# Patient Record
Sex: Female | Born: 1990 | Hispanic: No | Marital: Married | Smoking: Never smoker
Health system: Southern US, Community
[De-identification: ages and names within clinical notes are randomized; demographics above are authoritative.]

## PROBLEM LIST (undated history)

## (undated) DIAGNOSIS — Z789 Other specified health status: Secondary | ICD-10-CM

## (undated) HISTORY — DX: Other specified health status: Z78.9

## (undated) HISTORY — PX: NO PAST SURGERIES: SHX2092

---

## 2018-05-14 NOTE — L&D Delivery Note (Addendum)
Patient: Brittney Cox MRN: [id number]  GBS status: negative, IAP given none  Patient is a 28 y.o. now G3P3 s/p NSVD at [redacted]w[redacted]d, who was admitted for spontaneous onset of labor. AROM 0h 7m prior to delivery with clear fluid.   Delivery Note At 7:36 AM a viable and healthy female was delivered via Vaginal, Spontaneous (Presentation: ROA; vertex  ).  APGAR: pending ; weight pending .   Placenta status: spontaneous, intact.  3 vessel cord. With no complications.   Anesthesia:  None Episiotomy:  None Lacerations:  1st degree perineal tear Suture Repair: none Est. Blood Loss (mL):  371mL  Head delivered ROA. No nuchal cord present. Shoulder and body delivered in usual fashion. Infant with spontaneous cry, placed on mother's abdomen, dried and bulb suctioned. Cord clamped x 2 after 1-minute delay, and cut by family member. Cord blood drawn. Placenta delivered spontaneously with gentle cord traction. Fundus firm with massage and Pitocin. Perineum inspected and found to have 1st degree perineal laceration, which was found to be hemostatic.  Mom to postpartum.  Baby to Couplet care / Skin to Skin.  Danna Hefty 02/16/2019, 7:52 AM  Patient is a 28 y.o. at [redacted]w[redacted]d who was admitted for spontaneous onset of labor, uncomplicated prenatal course.  She progressed with augmentation via AROM.  I was gloved and present for delivery in its entirety.  Second stage of labor progressed, baby delivered after 3# contractions.   Complications: none  Lacerations: 1st degree, hemostatic  Wende Mott, CNM 8:44 AM

## 2018-09-19 DIAGNOSIS — Z3200 Encounter for pregnancy test, result unknown: Secondary | ICD-10-CM | POA: Diagnosis not present

## 2018-10-14 ENCOUNTER — Encounter: Payer: Self-pay | Admitting: Nurse Practitioner

## 2018-10-14 ENCOUNTER — Other Ambulatory Visit: Payer: Self-pay

## 2018-10-14 ENCOUNTER — Other Ambulatory Visit (HOSPITAL_COMMUNITY)
Admission: RE | Admit: 2018-10-14 | Discharge: 2018-10-14 | Disposition: A | Discharge status: Home / Self Care | Payer: Medicaid Other | Source: Ambulatory Visit | Attending: Nurse Practitioner | Admitting: Nurse Practitioner

## 2018-10-14 ENCOUNTER — Ambulatory Visit (INDEPENDENT_AMBULATORY_CARE_PROVIDER_SITE_OTHER): Payer: Medicaid Other | Admitting: Nurse Practitioner

## 2018-10-14 VITALS — BP 125/80 | HR 116 | Ht 65.0 in | Wt 165.6 lb

## 2018-10-14 DIAGNOSIS — Z3490 Encounter for supervision of normal pregnancy, unspecified, unspecified trimester: Secondary | ICD-10-CM | POA: Insufficient documentation

## 2018-10-14 DIAGNOSIS — Z315 Encounter for genetic counseling: Secondary | ICD-10-CM | POA: Diagnosis not present

## 2018-10-14 DIAGNOSIS — Z3481 Encounter for supervision of other normal pregnancy, first trimester: Secondary | ICD-10-CM | POA: Diagnosis not present

## 2018-10-14 DIAGNOSIS — O280 Abnormal hematological finding on antenatal screening of mother: Secondary | ICD-10-CM

## 2018-10-14 DIAGNOSIS — Z789 Other specified health status: Secondary | ICD-10-CM | POA: Insufficient documentation

## 2018-10-14 DIAGNOSIS — Z603 Acculturation difficulty: Secondary | ICD-10-CM

## 2018-10-14 DIAGNOSIS — Z3A17 17 weeks gestation of pregnancy: Secondary | ICD-10-CM

## 2018-10-14 DIAGNOSIS — Z3482 Encounter for supervision of other normal pregnancy, second trimester: Secondary | ICD-10-CM | POA: Diagnosis not present

## 2018-10-14 MED ORDER — VITAFOL ULTRA 29-0.6-0.4-200 MG PO CAPS: 1.0000 | ORAL_CAPSULE | Freq: Every day | ORAL | 11 refills | Status: AC

## 2018-10-14 NOTE — Patient Instructions (Addendum)
Safe Medications in Pregnancy   Acne: Benzoyl Peroxide Salicylic Acid  Backache/Headache: Tylenol: 2 regular strength every 4 hours OR              2 Extra strength every 6 hours  Colds/Coughs/Allergies: Benadryl (alcohol free) 25 mg every 6 hours as needed Breath right strips Claritin Cepacol throat lozenges Chloraseptic throat spray Cold-Eeze- up to three times per day Cough drops, alcohol free Flonase (by prescription only) Guaifenesin Mucinex Robitussin DM (plain only, alcohol free) Saline nasal spray/drops Sudafed (pseudoephedrine) & Actifed ** use only after [redacted] weeks gestation and if you do not have high blood pressure Tylenol Vicks Vaporub Zinc lozenges Zyrtec   Constipation: Colace Ducolax suppositories Fleet enema Glycerin suppositories Metamucil Milk of magnesia Miralax Senokot Smooth move tea  Diarrhea: Kaopectate Imodium A-D  *NO pepto Bismol  Hemorrhoids: Anusol Anusol HC Preparation H Tucks  Indigestion: Tums Maalox Mylanta Zantac  Pepcid  Insomnia: Benadryl (alcohol free) 25mg  every 6 hours as needed Tylenol PM Unisom, no Gelcaps  Leg Cramps: Tums MagGel  Nausea/Vomiting:  Bonine Dramamine Emetrol Ginger extract Sea bands Meclizine  Nausea medication to take during pregnancy:  Unisom (doxylamine succinate 25 mg tablets) Take one tablet daily at bedtime. If symptoms are not adequately controlled, the dose can be increased to a maximum recommended dose of two tablets daily (1/2 tablet in the morning, 1/2 tablet mid-afternoon and one at bedtime). Vitamin B6 100mg  tablets. Take one tablet twice a day (up to 200 mg per day).  Skin Rashes: Aveeno products Benadryl cream or 25mg  every 6 hours as needed Calamine Lotion 1% cortisone cream  Yeast infection: Gyne-lotrimin 7 Monistat 7   **If taking multiple medications, please check labels to avoid duplicating the same active ingredients **take medication as directed on  the label ** Do not exceed 4000 mg of tylenol in 24 hours **Do not take medications that contain aspirin or ibuprofen    Levonorgestrel intrauterine device (IUD) What is this medicine? LEVONORGESTREL IUD (LEE voe nor jes trel) is a contraceptive (birth control) device. The device is placed inside the uterus by a healthcare professional. It is used to prevent pregnancy. This device can also be used to treat heavy bleeding that occurs during your period. This medicine may be used for other purposes; ask your health care provider or pharmacist if you have questions. COMMON BRAND NAME(S): Cameron AliKyleena, LILETTA, Mirena, Skyla What should I tell my health care provider before I take this medicine? They need to know if you have any of these conditions: -abnormal Pap smear -cancer of the breast, uterus, or cervix -diabetes -endometritis -genital or pelvic infection now or in the past -have more than one sexual partner or your partner has more than one partner -heart disease -history of an ectopic or tubal pregnancy -immune system problems -IUD in place -liver disease or tumor -problems with blood clots or take blood-thinners -seizures -use intravenous drugs -uterus of unusual shape -vaginal bleeding that has not been explained -an unusual or allergic reaction to levonorgestrel, other hormones, silicone, or polyethylene, medicines, foods, dyes, or preservatives -pregnant or trying to get pregnant -breast-feeding How should I use this medicine? This device is placed inside the uterus by a health care professional. Talk to your pediatrician regarding the use of this medicine in children. Special care may be needed. Overdosage: If you think you have taken too much of this medicine contact a poison control center or emergency room at once. NOTE: This medicine is only for you. Do not  share this medicine with others. What if I miss a dose? This does not apply. Depending on the brand of device you  have inserted, the device will need to be replaced every 3 to 5 years if you wish to continue using this type of birth control. What may interact with this medicine? Do not take this medicine with any of the following medications: -amprenavir -bosentan -fosamprenavir This medicine may also interact with the following medications: -aprepitant -armodafinil -barbiturate medicines for inducing sleep or treating seizures -bexarotene -boceprevir -griseofulvin -medicines to treat seizures like carbamazepine, ethotoin, felbamate, oxcarbazepine, phenytoin, topiramate -modafinil -pioglitazone -rifabutin -rifampin -rifapentine -some medicines to treat HIV infection like atazanavir, efavirenz, indinavir, lopinavir, nelfinavir, tipranavir, ritonavir -St. John's wort -warfarin This list may not describe all possible interactions. Give your health care provider a list of all the medicines, herbs, non-prescription drugs, or dietary supplements you use. Also tell them if you smoke, drink alcohol, or use illegal drugs. Some items may interact with your medicine. What should I watch for while using this medicine? Visit your doctor or health care professional for regular check ups. See your doctor if you or your partner has sexual contact with others, becomes HIV positive, or gets a sexual transmitted disease. This product does not protect you against HIV infection (AIDS) or other sexually transmitted diseases. You can check the placement of the IUD yourself by reaching up to the top of your vagina with clean fingers to feel the threads. Do not pull on the threads. It is a good habit to check placement after each menstrual period. Call your doctor right away if you feel more of the IUD than just the threads or if you cannot feel the threads at all. The IUD may come out by itself. You may become pregnant if the device comes out. If you notice that the IUD has come out use a backup birth control method like  condoms and call your health care provider. Using tampons will not change the position of the IUD and are okay to use during your period. This IUD can be safely scanned with magnetic resonance imaging (MRI) only under specific conditions. Before you have an MRI, tell your healthcare provider that you have an IUD in place, and which type of IUD you have in place. What side effects may I notice from receiving this medicine? Side effects that you should report to your doctor or health care professional as soon as possible: -allergic reactions like skin rash, itching or hives, swelling of the face, lips, or tongue -fever, flu-like symptoms -genital sores -high blood pressure -no menstrual period for 6 weeks during use -pain, swelling, warmth in the leg -pelvic pain or tenderness -severe or sudden headache -signs of pregnancy -stomach cramping -sudden shortness of breath -trouble with balance, talking, or walking -unusual vaginal bleeding, discharge -yellowing of the eyes or skin Side effects that usually do not require medical attention (report to your doctor or health care professional if they continue or are bothersome): -acne -breast pain -change in sex drive or performance -changes in weight -cramping, dizziness, or faintness while the device is being inserted -headache -irregular menstrual bleeding within first 3 to 6 months of use -nausea This list may not describe all possible side effects. Call your doctor for medical advice about side effects. You may report side effects to FDA at 1-800-FDA-1088. Where should I keep my medicine? This does not apply. NOTE: This sheet is a summary. It may not cover all possible information. If you  have questions about this medicine, talk to your doctor, pharmacist, or health care provider.  2019 Elsevier/Gold Standard (2016-02-10 14:14:56)  Second Trimester of Pregnancy  The second trimester is from week 14 through week 27 (month 4 through 6).  This is often the time in pregnancy that you feel your best. Often times, morning sickness has lessened or quit. You may have more energy, and you may get hungry more often. Your unborn baby is growing rapidly. At the end of the sixth month, he or she is about 9 inches long and weighs about 1 pounds. You will likely feel the baby move between 18 and 20 weeks of pregnancy. Follow these instructions at home: Medicines  Take over-the-counter and prescription medicines only as told by your doctor. Some medicines are safe and some medicines are not safe during pregnancy.  Take a prenatal vitamin that contains at least 600 micrograms (mcg) of folic acid.  If you have trouble pooping (constipation), take medicine that will make your stool soft (stool softener) if your doctor approves. Eating and drinking   Eat regular, healthy meals.  Avoid raw meat and uncooked cheese.  If you get low calcium from the food you eat, talk to your doctor about taking a daily calcium supplement.  Avoid foods that are high in fat and sugars, such as fried and sweet foods.  If you feel sick to your stomach (nauseous) or throw up (vomit): ? Eat 4 or 5 small meals a day instead of 3 large meals. ? Try eating a few soda crackers. ? Drink liquids between meals instead of during meals.  To prevent constipation: ? Eat foods that are high in fiber, like fresh fruits and vegetables, whole grains, and beans. ? Drink enough fluids to keep your pee (urine) clear or pale yellow. Activity  Exercise only as told by your doctor. Stop exercising if you start to have cramps.  Do not exercise if it is too hot, too humid, or if you are in a place of great height (high altitude).  Avoid heavy lifting.  Wear low-heeled shoes. Sit and stand up straight.  You can continue to have sex unless your doctor tells you not to. Relieving pain and discomfort  Wear a good support bra if your breasts are tender.  Take warm water baths  (sitz baths) to soothe pain or discomfort caused by hemorrhoids. Use hemorrhoid cream if your doctor approves.  Rest with your legs raised if you have leg cramps or low back pain.  If you develop puffy, bulging veins (varicose veins) in your legs: ? Wear support hose or compression stockings as told by your doctor. ? Raise (elevate) your feet for 15 minutes, 3-4 times a day. ? Limit salt in your food. Prenatal care  Write down your questions. Take them to your prenatal visits.  Keep all your prenatal visits as told by your doctor. This is important. Safety  Wear your seat belt when driving.  Make a list of emergency phone numbers, including numbers for family, friends, the hospital, and police and fire departments. General instructions  Ask your doctor about the right foods to eat or for help finding a counselor, if you need these services.  Ask your doctor about local prenatal classes. Begin classes before month 6 of your pregnancy.  Do not use hot tubs, steam rooms, or saunas.  Do not douche or use tampons or scented sanitary pads.  Do not cross your legs for long periods of time.  Visit your  dentist if you have not done so. Use a soft toothbrush to brush your teeth. Floss gently.  Avoid all smoking, herbs, and alcohol. Avoid drugs that are not approved by your doctor.  Do not use any products that contain nicotine or tobacco, such as cigarettes and e-cigarettes. If you need help quitting, ask your doctor.  Avoid cat litter boxes and soil used by cats. These carry germs that can cause birth defects in the baby and can cause a loss of your baby (miscarriage) or stillbirth. Contact a doctor if:  You have mild cramps or pressure in your lower belly.  You have pain when you pee (urinate).  You have bad smelling fluid coming from your vagina.  You continue to feel sick to your stomach (nauseous), throw up (vomit), or have watery poop (diarrhea).  You have a nagging pain in  your belly area.  You feel dizzy. Get help right away if:  You have a fever.  You are leaking fluid from your vagina.  You have spotting or bleeding from your vagina.  You have severe belly cramping or pain.  You lose or gain weight rapidly.  You have trouble catching your breath and have chest pain.  You notice sudden or extreme puffiness (swelling) of your face, hands, ankles, feet, or legs.  You have not felt the baby move in over an hour.  You have severe headaches that do not go away when you take medicine.  You have trouble seeing. Summary  The second trimester is from week 14 through week 27 (months 4 through 6). This is often the time in pregnancy that you feel your best.  To take care of yourself and your unborn baby, you will need to eat healthy meals, take medicines only if your doctor tells you to do so, and do activities that are safe for you and your baby.  Call your doctor if you get sick or if you notice anything unusual about your pregnancy. Also, call your doctor if you need help with the right food to eat, or if you want to know what activities are safe for you. This information is not intended to replace advice given to you by your health care provider. Make sure you discuss any questions you have with your health care provider. Document Released: 07/25/2009 Document Revised: 06/05/2016 Document Reviewed: 06/05/2016 Elsevier Interactive Patient Education  2019 [employer].

## 2018-10-14 NOTE — Progress Notes (Signed)
Subjective:   Brittney Cox is a 28 y.o. G3P2002 at 4115w4d by LMP being seen today for her Bridget Hartshornfirst obstetrical visit.  Her obstetrical history is significant for language barrier. Patient does intend to breast feed. Pregnancy history fully reviewed.  Interpreter present for entire interview, labs, exam and checkout.  Patient reports some pelvic pressure when doing housework. Is not constant,  No vaginal bleeding.  [place] in the [place] since [place] and lived in [place] and had [place] other 2 babies there.  Reports no problems with those pregnancies.  HISTORY: OB History  Gravida Para Term Preterm AB Living  3 2 2  0 0 2  SAB TAB Ectopic Multiple Live Births  0 0 0 0 2    # Outcome Date GA Lbr Len/2nd Weight Sex Delivery Anes PTL Lv  3 Current           2 Term 07/13/14    M Vag-Spont   LIV  1 Term 07/04/12    F Vag-Spont   LIV   History reviewed. No pertinent past medical history. History reviewed. No pertinent surgical history. History reviewed. No pertinent family history. Social History   Tobacco Use  . Smoking status: Never Smoker  . Smokeless tobacco: Never Used  Substance Use Topics  . Alcohol use: Never    Frequency: Never  . Drug use: Never   Not on File Current Outpatient Medications on File Prior to Visit  Medication Sig Dispense Refill  . Prenatal Vit-Fe Fumarate-FA (MULTIVITAMIN-PRENATAL) 27-0.8 MG TABS tablet Take 1 tablet by mouth daily at 12 noon.     No current facility-administered medications on file prior to visit.      Exam   Vitals:   10/14/18 0935 10/14/18 0936  BP: 125/80   Pulse: (!) 116   Weight: 165 lb 9.6 oz (75.1 kg)   Height:  5\' 5"  (1.651 m)   Fetal Heart Rate (bpm): 155  Uterus:   At umbilicus  Pelvic Exam: Perineum: no hemorrhoids, normal perineum   Vulva: normal external genitalia, no lesions   Vagina:  normal mucosa, normal discharge   Cervix: no lesions and normal, pap smear done.    Adnexa: normal adnexa and no mass, fullness,  tenderness   Bony Pelvis: average  System: General: well-developed, well-nourished female in no acute distress   Breast:  normal appearance, no masses or tenderness   Skin: normal coloration and turgor, no rashes   Neurologic: oriented, normal, negative, normal mood   Extremities: normal strength, tone, and muscle mass, ROM of all joints is normal   HEENT extraocular movement intact and sclera clear, anicteric   Mouth/Teeth mucous membranes moist, pharynx normal without lesions and dental hygiene good   Neck supple and no masses, normal thyroid   Cardiovascular: regular rate and rhythm   Respiratory:  no respiratory distress, normal breath sounds   Abdomen: soft, non-tender; no masses,  no organomegaly     Assessment:   Pregnancy: [id number] Patient Active Problem List   Diagnosis Date Noted  . Encounter for supervision of normal pregnancy, unspecified, unspecified trimester 10/14/2018     Plan:  1. Encounter for supervision of normal pregnancy, antepartum, unspecified gravidity Reviewed that her [facility] with [facility] Reviewed that visits will be by phone sometimes and sometimes in the clinic.  Advised keeping her mask today and wearing it to visits here in the clinic. Prenatal vitamins ordered. - Cytology - PAP( [place]) -  Cervicovaginal ancillary only( [place]) - Genetic Screening - Culture, OB Urine - Obstetric Panel, Including HIV   Initial labs drawn. Continue prenatal vitamins. Genetic Screening discussed, AFP and NIPS: ordered. Ultrasound discussed; fetal anatomic survey: ordered. Problem list reviewed and updated. The nature of [facility] with multiple MDs and other Advanced Practice Providers was explained to patient; also emphasized that residents, students are part of our team. Routine obstetric precautions reviewed. Return in about 4 weeks (around 11/11/2018) for virtual vist with interpreter.   Total face-to-face time with patient: 40 minutes.  Over 50% of encounter was spent on counseling and coordination of care.     Nolene Bernheim, FNP Family Nurse Practitioner, [facility] for [employer], [facility] 10/14/2018 10:30 AM

## 2018-10-15 LAB — OBSTETRIC PANEL, INCLUDING HIV
Antibody Screen: NEGATIVE
Basophils Absolute: 0 10*3/uL (ref 0.0–0.2)
Basos: 0 %
EOS (ABSOLUTE): 0.1 10*3/uL (ref 0.0–0.4)
Eos: 1 %
HIV Screen 4th Generation wRfx: NONREACTIVE
Hematocrit: 31.7 % — ABNORMAL LOW (ref 34.0–46.6)
Hemoglobin: 10.8 g/dL — ABNORMAL LOW (ref 11.1–15.9)
Hepatitis B Surface Ag: NEGATIVE
Immature Grans (Abs): 0 10*3/uL (ref 0.0–0.1)
Immature Granulocytes: 0 %
Lymphocytes Absolute: 1 10*3/uL (ref 0.7–3.1)
Lymphs: 20 %
MCH: 30.4 pg (ref 26.6–33.0)
MCHC: 34.1 g/dL (ref 31.5–35.7)
MCV: 89 fL (ref 79–97)
Monocytes Absolute: 0.2 10*3/uL (ref 0.1–0.9)
Monocytes: 5 %
Neutrophils Absolute: 3.5 10*3/uL (ref 1.4–7.0)
Neutrophils: 74 %
Platelets: 199 10*3/uL (ref 150–450)
RBC: 3.55 x10E6/uL — ABNORMAL LOW (ref 3.77–5.28)
RDW: 12.9 % (ref 11.7–15.4)
RPR Ser Ql: NONREACTIVE
Rh Factor: POSITIVE
Rubella Antibodies, IGG: 5.59 index (ref >0.99)
WBC: 4.8 10*3/uL (ref 3.4–10.8)

## 2018-10-15 LAB — CERVICOVAGINAL ANCILLARY ONLY
Bacterial vaginitis: NEGATIVE
Candida vaginitis: NEGATIVE
Chlamydia: NEGATIVE
Neisseria Gonorrhea: NEGATIVE
Trichomonas: NEGATIVE

## 2018-10-16 DIAGNOSIS — O280 Abnormal hematological finding on antenatal screening of mother: Secondary | ICD-10-CM | POA: Insufficient documentation

## 2018-10-16 LAB — AFP, SERUM, OPEN SPINA BIFIDA
AFP MoM: 2.85
AFP Value: 105 ng/mL
Gest. Age on Collection Date: 17.4 weeks
Maternal Age At EDD: 30.7 yr
OSBR Risk 1 IN: 152
Test Results:: POSITIVE — AB
Weight: 165 [lb_av]

## 2018-10-16 LAB — CULTURE, OB URINE

## 2018-10-16 LAB — URINE CULTURE, OB REFLEX

## 2018-10-16 NOTE — Addendum Note (Signed)
Addended by: Currie Paris on: 10/16/2018 07:42 AM   Modules accepted: Orders

## 2018-10-16 NOTE — Progress Notes (Signed)
Abnormal AFP result.  Is scheduled for initial [place] on 10-24-18.  Requested genetic counseling at that visit.  Will review [place] EDC carefully and compare to LMP EDC as this test result may need to be recalculated if there is EDC discrepancy.  Nolene Bernheim, RN, MSN, NP-BC Nurse Practitioner, [facility] for [employer], [facility] 10/16/2018 7:41 AM

## 2018-10-17 LAB — CYTOLOGY - PAP
Diagnosis: NEGATIVE
HPV: NOT DETECTED

## 2018-10-22 ENCOUNTER — Encounter: Payer: Self-pay | Admitting: Nurse Practitioner

## 2018-10-24 ENCOUNTER — Other Ambulatory Visit: Payer: Self-pay

## 2018-10-24 ENCOUNTER — Other Ambulatory Visit: Payer: Self-pay | Admitting: Nurse Practitioner

## 2018-10-24 ENCOUNTER — Ambulatory Visit (HOSPITAL_COMMUNITY): Payer: Medicaid Other

## 2018-10-24 ENCOUNTER — Other Ambulatory Visit (HOSPITAL_COMMUNITY): Payer: Self-pay | Admitting: *Deleted

## 2018-10-24 ENCOUNTER — Ambulatory Visit (HOSPITAL_COMMUNITY): Payer: Medicaid Other | Admitting: *Deleted

## 2018-10-24 ENCOUNTER — Ambulatory Visit (HOSPITAL_COMMUNITY)
Admission: RE | Admit: 2018-10-24 | Discharge: 2018-10-24 | Disposition: A | Discharge status: Home / Self Care | Payer: Medicaid Other | Source: Ambulatory Visit | Attending: Obstetrics and Gynecology | Admitting: Obstetrics and Gynecology

## 2018-10-24 ENCOUNTER — Encounter (HOSPITAL_COMMUNITY): Payer: Self-pay

## 2018-10-24 ENCOUNTER — Encounter: Payer: Self-pay | Admitting: Nurse Practitioner

## 2018-10-24 VITALS — BP 133/74 | HR 126 | Temp 98.8°F

## 2018-10-24 DIAGNOSIS — Z363 Encounter for antenatal screening for malformations: Secondary | ICD-10-CM | POA: Diagnosis not present

## 2018-10-24 DIAGNOSIS — O289 Unspecified abnormal findings on antenatal screening of mother: Secondary | ICD-10-CM

## 2018-10-24 DIAGNOSIS — Z3490 Encounter for supervision of normal pregnancy, unspecified, unspecified trimester: Secondary | ICD-10-CM

## 2018-10-24 DIAGNOSIS — Z362 Encounter for other antenatal screening follow-up: Secondary | ICD-10-CM

## 2018-10-24 DIAGNOSIS — O0990 Supervision of high risk pregnancy, unspecified, unspecified trimester: Secondary | ICD-10-CM | POA: Insufficient documentation

## 2018-10-24 DIAGNOSIS — Z3A23 23 weeks gestation of pregnancy: Secondary | ICD-10-CM | POA: Diagnosis not present

## 2018-11-11 ENCOUNTER — Encounter: Payer: Self-pay | Admitting: Obstetrics

## 2018-11-11 ENCOUNTER — Ambulatory Visit (INDEPENDENT_AMBULATORY_CARE_PROVIDER_SITE_OTHER): Payer: Medicaid Other | Admitting: Obstetrics

## 2018-11-11 DIAGNOSIS — O280 Abnormal hematological finding on antenatal screening of mother: Secondary | ICD-10-CM

## 2018-11-11 DIAGNOSIS — Z789 Other specified health status: Secondary | ICD-10-CM

## 2018-11-11 DIAGNOSIS — Z3490 Encounter for supervision of normal pregnancy, unspecified, unspecified trimester: Secondary | ICD-10-CM

## 2018-11-11 DIAGNOSIS — Z3A25 25 weeks gestation of pregnancy: Secondary | ICD-10-CM

## 2018-11-11 MED ORDER — BLOOD PRESSURE CUFF MISC: 1.0000 | Freq: Once | 0 refills | Status: AC

## 2018-11-11 NOTE — Progress Notes (Signed)
Pt is on the phone preparing for visit with provider. [redacted]w[redacted]d. Pt reports she has not received a BP cuff yet, she denies headaches or blurry vision. I advised pt that I will be ordering a BP cuff to be delivered to her in the mail, pt verbalizes understanding.

## 2018-11-11 NOTE — Progress Notes (Signed)
   TELEHEALTH VIRTUAL OBSTETRICS VISIT ENCOUNTER NOTE  I connected with Brittney Cox on 11/11/18 at  2:00 PM EDT by telephone at home and verified that I am speaking with the correct person using two identifiers.   I discussed the limitations, risks, security and privacy concerns of performing an evaluation and management service by telephone and the availability of in person appointments. I also discussed with the patient that there may be a patient responsible charge related to this service. The patient expressed understanding and agreed to proceed.  Subjective:  Brittney Cox is a 28 y.o. G3P2002 at [redacted]w[redacted]d being followed for ongoing prenatal care.  She is currently monitored for the following issues for this low-risk pregnancy and has Encounter for supervision of normal pregnancy, unspecified, unspecified trimester; Language barrier; and Abnormal antenatal AFP screen on their problem list.  Patient reports no complaints. Reports fetal movement. Denies any contractions, bleeding or leaking of fluid.   The following portions of the patient's history were reviewed and updated as appropriate: allergies, current medications, past family history, past medical history, past social history, past surgical history and problem list.   Objective:   General:  Alert, oriented and cooperative.   Mental Status: Normal mood and affect perceived. Normal judgment and thought content.  Rest of physical exam deferred due to type of encounter  Assessment and Plan:  Pregnancy: G3P2002 at [redacted]w[redacted]d 1. Encounter for supervision of normal pregnancy, antepartum, unspecified gravidity Rx: - Blood Pressure Monitoring (BLOOD PRESSURE CUFF) MISC; 1 Device by Does not apply route once for 1 dose.  Dispense: 1 each; Refill: 0   Preterm labor symptoms and general obstetric precautions including but not limited to vaginal bleeding, contractions, leaking of fluid and fetal movement were reviewed in detail with the  patient.  I discussed the assessment and treatment plan with the patient. The patient was provided an opportunity to ask questions and all were answered. The patient agreed with the plan and demonstrated an understanding of the instructions. The patient was advised to call back or seek an in-person office evaluation/go to [facility] at [facility] for any urgent or concerning symptoms. Please refer to After Visit Summary for other counseling recommendations.   I provided 10 minutes of non-face-to-face time during this encounter.  Return in about 3 weeks (around 12/02/2018) for Ack[place].  , 2 hour OGTT.  Future Appointments  Date Time Provider Sa[place]  11/19/2018 10:15 AM WH-MFC [place] 4 WH-MFCUS MFC-US    Charles Harper, MD [facility], [facility] 11-11-2018

## 2018-11-19 ENCOUNTER — Encounter (HOSPITAL_COMMUNITY): Payer: Self-pay

## 2018-11-19 ENCOUNTER — Ambulatory Visit (HOSPITAL_COMMUNITY): Payer: Medicaid Other

## 2018-11-22 ENCOUNTER — Other Ambulatory Visit: Payer: Self-pay

## 2018-11-22 ENCOUNTER — Ambulatory Visit (HOSPITAL_COMMUNITY)
Admission: RE | Admit: 2018-11-22 | Discharge: 2018-11-22 | Disposition: A | Discharge status: Home / Self Care | Payer: Medicaid Other | Source: Ambulatory Visit | Attending: Obstetrics and Gynecology | Admitting: Obstetrics and Gynecology

## 2018-11-22 ENCOUNTER — Other Ambulatory Visit (HOSPITAL_COMMUNITY): Payer: Self-pay | Admitting: *Deleted

## 2018-11-22 DIAGNOSIS — O289 Unspecified abnormal findings on antenatal screening of mother: Secondary | ICD-10-CM

## 2018-11-22 DIAGNOSIS — Z3A27 27 weeks gestation of pregnancy: Secondary | ICD-10-CM

## 2018-11-22 DIAGNOSIS — Z362 Encounter for other antenatal screening follow-up: Secondary | ICD-10-CM

## 2018-11-22 DIAGNOSIS — O280 Abnormal hematological finding on antenatal screening of mother: Secondary | ICD-10-CM

## 2018-11-24 DIAGNOSIS — Z136 Encounter for screening for cardiovascular disorders: Secondary | ICD-10-CM | POA: Diagnosis not present

## 2018-12-02 ENCOUNTER — Other Ambulatory Visit: Payer: Self-pay

## 2018-12-02 ENCOUNTER — Ambulatory Visit (INDEPENDENT_AMBULATORY_CARE_PROVIDER_SITE_OTHER): Payer: Medicaid Other | Admitting: Obstetrics and Gynecology

## 2018-12-02 ENCOUNTER — Other Ambulatory Visit: Payer: Medicaid Other

## 2018-12-02 ENCOUNTER — Encounter: Payer: Self-pay | Admitting: Obstetrics and Gynecology

## 2018-12-02 VITALS — BP 116/74 | HR 121 | Wt 169.0 lb

## 2018-12-02 DIAGNOSIS — Z3A28 28 weeks gestation of pregnancy: Secondary | ICD-10-CM

## 2018-12-02 DIAGNOSIS — Z3490 Encounter for supervision of normal pregnancy, unspecified, unspecified trimester: Secondary | ICD-10-CM | POA: Diagnosis not present

## 2018-12-02 DIAGNOSIS — Z3493 Encounter for supervision of normal pregnancy, unspecified, third trimester: Secondary | ICD-10-CM

## 2018-12-02 DIAGNOSIS — Z789 Other specified health status: Secondary | ICD-10-CM

## 2018-12-02 NOTE — Progress Notes (Signed)
   PRENATAL VISIT NOTE  Subjective:  Brittney Cox is a 28 y.o. G3P2002 at [redacted]w[redacted]d being seen today for ongoing prenatal care.  She is currently monitored for the following issues for this low-risk pregnancy and has Encounter for supervision of normal pregnancy, unspecified, unspecified trimester; Language barrier; and Abnormal antenatal AFP screen on their problem list.  Patient reports no complaints.  Contractions: Not present. Vag. Bleeding: None.  Movement: Present. Denies leaking of fluid.   The following portions of the patient's history were reviewed and updated as appropriate: allergies, current medications, past family history, past medical history, past social history, past surgical history and problem list.   Objective:   Vitals:   12/02/18 0919  BP: 116/74  Pulse: (!) 121  Weight: 169 lb (76.7 kg)    Fetal Status: Fetal Heart Rate (bpm): 150 Fundal Height: 29 cm Movement: Present     General:  Alert, oriented and cooperative. Patient is in no acute distress.  Skin: Skin is warm and dry. No rash noted.   Cardiovascular: Normal heart rate noted  Respiratory: Normal respiratory effort, no problems with respiration noted  Abdomen: Soft, gravid, appropriate for gestational age.  Pain/Pressure: Absent     Pelvic: Cervical exam deferred        Extremities: Normal range of motion.     Mental Status: Normal mood and affect. Normal behavior. Normal judgment and thought content.   Assessment and Plan:  Pregnancy: G3P2002 at [redacted]w[redacted]d 1. Encounter for supervision of normal pregnancy, antepartum, unspecified gravidity Patient is doing well Third trimester labs today - Glucose Tolerance, 2 Hours w/1 Hour - CBC - RPR - HIV Antibody (routine testing w rflx)  2. Language barrier Arabic interpreter used  Preterm labor symptoms and general obstetric precautions including but not limited to vaginal bleeding, contractions, leaking of fluid and fetal movement were reviewed in detail with  the patient. Please refer to After Visit Summary for other counseling recommendations.   Return in about 2 weeks (around 12/16/2018) for [facility], [place].  Future Appointments  Date Time Provider [place]  12/18/2018  2:30 PM [place] 1 WH-MFCUS MFC-US    Mora Bellman, MD

## 2018-12-03 LAB — CBC
Hematocrit: 31 % — ABNORMAL LOW (ref 34.0–46.6)
Hemoglobin: 10.4 g/dL — ABNORMAL LOW (ref 11.1–15.9)
MCH: 31 pg (ref 26.6–33.0)
MCHC: 33.5 g/dL (ref 31.5–35.7)
MCV: 93 fL (ref 79–97)
Platelets: 171 10*3/uL (ref 150–450)
RBC: 3.35 x10E6/uL — ABNORMAL LOW (ref 3.77–5.28)
RDW: 13.4 % (ref 11.7–15.4)
WBC: 5.7 10*3/uL (ref 3.4–10.8)

## 2018-12-03 LAB — GLUCOSE TOLERANCE, 2 HOURS W/ 1HR
Glucose, 1 hour: 177 mg/dL (ref 65–179)
Glucose, 2 hour: 127 mg/dL (ref 65–152)
Glucose, Fasting: 89 mg/dL (ref 65–91)

## 2018-12-03 LAB — RPR: RPR Ser Ql: NONREACTIVE

## 2018-12-03 LAB — HIV ANTIBODY (ROUTINE TESTING W REFLEX): HIV Screen 4th Generation wRfx: NONREACTIVE

## 2018-12-16 ENCOUNTER — Ambulatory Visit (INDEPENDENT_AMBULATORY_CARE_PROVIDER_SITE_OTHER): Payer: Medicaid Other | Admitting: Medical

## 2018-12-16 ENCOUNTER — Encounter: Payer: Self-pay | Admitting: Medical

## 2018-12-16 VITALS — BP 133/68 | HR 111

## 2018-12-16 DIAGNOSIS — Z3A30 30 weeks gestation of pregnancy: Secondary | ICD-10-CM | POA: Diagnosis not present

## 2018-12-16 DIAGNOSIS — O280 Abnormal hematological finding on antenatal screening of mother: Secondary | ICD-10-CM | POA: Diagnosis not present

## 2018-12-16 DIAGNOSIS — Z3480 Encounter for supervision of other normal pregnancy, unspecified trimester: Secondary | ICD-10-CM

## 2018-12-16 DIAGNOSIS — Z789 Other specified health status: Secondary | ICD-10-CM

## 2018-12-16 DIAGNOSIS — Z3490 Encounter for supervision of normal pregnancy, unspecified, unspecified trimester: Secondary | ICD-10-CM

## 2018-12-16 MED ORDER — PRENATAL 27-0.8 MG PO TABS: 1.0000 | ORAL_TABLET | Freq: Every day | ORAL | 6 refills | Status: DC

## 2018-12-16 NOTE — Progress Notes (Signed)
   TELEHEALTH VIRTUAL OBSTETRICS VISIT ENCOUNTER NOTE  I connected with Brittney Cox on 12/16/18 at  2:15 PM EDT by telephone at home and verified that I am speaking with the correct person using two identifiers.   I discussed the limitations, risks, security and privacy concerns of performing an evaluation and management service by telephone and the availability of in person appointments. I also discussed with the patient that there may be a patient responsible charge related to this service. The patient expressed understanding and agreed to proceed.  Subjective:  Brittney Cox is a 28 y.o. G3P2002 at [redacted]w[redacted]d being followed for ongoing prenatal care.  She is currently monitored for the following issues for this low-risk pregnancy and has Encounter for supervision of normal pregnancy, unspecified, unspecified trimester; Language barrier; and Abnormal antenatal AFP screen on their problem list.  Patient reports no complaints. Reports fetal movement. Denies any contractions, bleeding or leaking of fluid.   The following portions of the patient's history were reviewed and updated as appropriate: allergies, current medications, past family history, past medical history, past social history, past surgical history and problem list.   Objective:   General:  Alert, oriented and cooperative.   Mental Status: Normal mood and affect perceived. Normal judgment and thought content.  Rest of physical exam deferred due to type of encounter  Assessment and Plan:  Pregnancy: G3P2002 at [redacted]w[redacted]d 1. Supervision of other normal pregnancy, antepartum - Doing well - Requests refill on prenatal vitamins, sent to pharmacy of choice  - Prenatal Vit-Fe Fumarate-FA (MULTIVITAMIN-PRENATAL) 27-0.8 MG TABS tablet; Take 1 tablet by mouth daily at 12 noon.  Dispense: 30 tablet; Refill: 6 - Patient has follow-up growth [place] on 12/18/18  2. Language barrier - [place] interpreter used   3. Abnormal antenatal AFP screen   Preterm labor symptoms and general obstetric precautions including but not limited to vaginal bleeding, contractions, leaking of fluid and fetal movement were reviewed in detail with the patient.  I discussed the assessment and treatment plan with the patient. The patient was provided an opportunity to ask questions and all were answered. The patient agreed with the plan and demonstrated an understanding of the instructions. The patient was advised to call back or seek an in-person office evaluation/go to MAU at [facility] for any urgent or concerning symptoms. Please refer to After Visit Summary for other counseling recommendations.   I provided 15 minutes of non-face-to-face time during this encounter.  Return in about 2 weeks (around 12/30/2018) for LOB, Virtual.  Future Appointments  Date Time Provider [place]  12/18/2018  2:30 PM WH-MFC [place]    Kerry Hough, [place] for [employer], [place]

## 2018-12-16 NOTE — Patient Instructions (Signed)
Fetal Movement Counts Patient Name: ________________________________________________ Patient Due Date: ____________________ What is a fetal movement count?  A fetal movement count is the number of times that you feel your baby move during a certain amount of time. This may also be called a fetal kick count. A fetal movement count is recommended for every pregnant woman. You may be asked to start counting fetal movements as early as week 28 of your pregnancy. Pay attention to when your baby is most active. You may notice your baby's sleep and wake cycles. You may also notice things that make your baby move more. You should do a fetal movement count:  When your baby is normally most active.  At the same time each day. A good time to count movements is while you are resting, after having something to eat and drink. How do I count fetal movements? 1. Find a quiet, comfortable area. Sit, or lie down on your side. 2. Write down the date, the start time and stop time, and the number of movements that you felt between those two times. Take this information with you to your health care visits. 3. For 2 hours, count kicks, flutters, swishes, rolls, and jabs. You should feel at least 10 movements during 2 hours. 4. You may stop counting after you have felt 10 movements. 5. If you do not feel 10 movements in 2 hours, have something to eat and drink. Then, keep resting and counting for 1 hour. If you feel at least 4 movements during that hour, you may stop counting. Contact a health care provider if:  You feel fewer than 4 movements in 2 hours.  Your baby is not moving like he or she usually does. Date: ____________ Start time: ____________ Stop time: ____________ Movements: ____________ Date: ____________ Start time: ____________ Stop time: ____________ Movements: ____________ Date: ____________ Start time: ____________ Stop time: ____________ Movements: ____________ Date: ____________ Start time:  ____________ Stop time: ____________ Movements: ____________ Date: ____________ Start time: ____________ Stop time: ____________ Movements: ____________ Date: ____________ Start time: ____________ Stop time: ____________ Movements: ____________ Date: ____________ Start time: ____________ Stop time: ____________ Movements: ____________ Date: ____________ Start time: ____________ Stop time: ____________ Movements: ____________ Date: ____________ Start time: ____________ Stop time: ____________ Movements: ____________ This information is not intended to replace advice given to you by your health care provider. Make sure you discuss any questions you have with your health care provider. Document Released: 05/30/2006 Document Revised: 05/20/2018 Document Reviewed: 06/09/2015 Elsevier Patient Education  2020 Else[employer] Inc[employer] Braxton Hicks Contractions Contractions of the uterus can occur throughout pregnancy, but they are not always a sign that you are in labor. You may have practice contractions called Braxton Hicks contractions. These false labor contractions are sometimes confused with true labor. What are Braxton Hicks contractions? Braxton Hicks contractions are tightening movements that occur in the muscles of the uterus before labor. Unlike true labor contractions, these contractions do not result in opening (dilation) and thinning of the cervix. Toward the end of pregnancy (32-34 weeks), Braxton Hicks contractions can happen more often and may become stronger. These contractions are sometimes difficult to tell apart from true labor because they can be very uncomfortable. You should not feel embarrassed if you go to the hospital with false labor. Sometimes, the only way to tell if you are in true labor is for your health care provider to look for changes in the cervix. The health care provider will do a physical exam and may monitor your contractions. If you   are not in true labor, the exam should show  that your cervix is not dilating and your water has not broken. If there are no other health problems associated with your pregnancy, it is completely safe for you to be sent home with false labor. You may continue to have Braxton Hicks contractions until you go into true labor. How to tell the difference between true labor and false labor True labor  Contractions last 30-70 seconds.  Contractions become very regular.  Discomfort is usually felt in the top of the uterus, and it spreads to the lower abdomen and low back.  Contractions do not go away with walking.  Contractions usually become more intense and increase in frequency.  The cervix dilates and gets thinner. False labor  Contractions are usually shorter and not as strong as true labor contractions.  Contractions are usually irregular.  Contractions are often felt in the front of the lower abdomen and in the groin.  Contractions may go away when you walk around or change positions while lying down.  Contractions get weaker and are shorter-lasting as time goes on.  The cervix usually does not dilate or become thin. Follow these instructions at home:   Take over-the-counter and prescription medicines only as told by your health care provider.  Keep up with your usual exercises and follow other instructions from your health care provider.  Eat and drink lightly if you think you are going into labor.  If Braxton Hicks contractions are making you uncomfortable: ? Change your position from lying down or resting to walking, or change from walking to resting. ? Sit and rest in a tub of warm water. ? Drink enough fluid to keep your urine pale yellow. Dehydration may cause these contractions. ? Do slow and deep breathing several times an hour.  Keep all follow-up prenatal visits as told by your health care provider. This is important. Contact a health care provider if:  You have a fever.  You have continuous pain in  your abdomen. Get help right away if:  Your contractions become stronger, more regular, and closer together.  You have fluid leaking or gushing from your vagina.  You pass blood-tinged mucus (bloody show).  You have bleeding from your vagina.  You have low back pain that you never had before.  You feel your baby's head pushing down and causing pelvic pressure.  Your baby is not moving inside you as much as it used to. Summary  Contractions that occur before labor are called Braxton Hicks contractions, false labor, or practice contractions.  Braxton Hicks contractions are usually shorter, weaker, farther apart, and less regular than true labor contractions. True labor contractions usually become progressively stronger and regular, and they become more frequent.  Manage discomfort from Braxton Hicks contractions by changing position, resting in a warm bath, drinking plenty of water, or practicing deep breathing. This information is not intended to replace advice given to you by your health care provider. Make sure you discuss any questions you have with your health care provider. Document Released: 09/13/2016 Document Revised: 04/12/2017 Document Reviewed: 09/13/2016 Elsevier Patient Education  2020 Elsevier Inc.  

## 2018-12-18 ENCOUNTER — Ambulatory Visit (HOSPITAL_COMMUNITY)
Admission: RE | Admit: 2018-12-18 | Discharge: 2018-12-18 | Disposition: A | Discharge status: Home / Self Care | Payer: Medicaid Other | Source: Ambulatory Visit | Attending: Obstetrics and Gynecology | Admitting: Obstetrics and Gynecology

## 2018-12-18 ENCOUNTER — Other Ambulatory Visit: Payer: Self-pay

## 2018-12-18 DIAGNOSIS — O280 Abnormal hematological finding on antenatal screening of mother: Secondary | ICD-10-CM | POA: Insufficient documentation

## 2018-12-18 DIAGNOSIS — O289 Unspecified abnormal findings on antenatal screening of mother: Secondary | ICD-10-CM

## 2018-12-18 DIAGNOSIS — Z362 Encounter for other antenatal screening follow-up: Secondary | ICD-10-CM | POA: Diagnosis not present

## 2018-12-18 DIAGNOSIS — Z3A31 31 weeks gestation of pregnancy: Secondary | ICD-10-CM

## 2018-12-30 ENCOUNTER — Encounter: Payer: Self-pay | Admitting: Obstetrics

## 2018-12-30 ENCOUNTER — Ambulatory Visit (INDEPENDENT_AMBULATORY_CARE_PROVIDER_SITE_OTHER): Payer: Medicaid Other | Admitting: Obstetrics

## 2018-12-30 VITALS — BP 116/67 | HR 107

## 2018-12-30 DIAGNOSIS — D508 Other iron deficiency anemias: Secondary | ICD-10-CM | POA: Diagnosis not present

## 2018-12-30 DIAGNOSIS — Z789 Other specified health status: Secondary | ICD-10-CM

## 2018-12-30 DIAGNOSIS — Z3480 Encounter for supervision of other normal pregnancy, unspecified trimester: Secondary | ICD-10-CM

## 2018-12-30 DIAGNOSIS — O99013 Anemia complicating pregnancy, third trimester: Secondary | ICD-10-CM | POA: Diagnosis not present

## 2018-12-30 DIAGNOSIS — Z3A32 32 weeks gestation of pregnancy: Secondary | ICD-10-CM | POA: Diagnosis not present

## 2018-12-30 MED ORDER — FERROUS SULFATE 325 (65 FE) MG PO TABS: 325.0000 mg | ORAL_TABLET | Freq: Two times a day (BID) | ORAL | 5 refills | Status: DC

## 2018-12-30 NOTE — Progress Notes (Signed)
   TELEHEALTH VIRTUAL OBSTETRICS VISIT ENCOUNTER NOTE  I connected with Brittney Cox on 12/30/18 at  2:15 PM EDT by telephone at home and verified that I am speaking with the correct person using two identifiers.   I discussed the limitations, risks, security and privacy concerns of performing an evaluation and management service by telephone and the availability of in person appointments. I also discussed with the patient that there may be a patient responsible charge related to this service. The patient expressed understanding and agreed to proceed.  Subjective:  Brittney Cox is a 27 y.o. G3P2002 at [redacted]w[redacted]d being followed for ongoing prenatal care.  She is currently monitored for the following issues for this low-risk pregnancy and has Encounter for supervision of normal pregnancy, unspecified, unspecified trimester; Language barrier; and Abnormal antenatal AFP screen on their problem list.  Patient reports no complaints. Reports fetal movement. Denies any contractions, bleeding or leaking of fluid.   The following portions of the patient's history were reviewed and updated as appropriate: allergies, current medications, past family history, past medical history, past social history, past surgical history and problem list.   Objective:   General:  Alert, oriented and cooperative.   Mental Status: Normal mood and affect perceived. Normal judgment and thought content.  Rest of physical exam deferred due to type of encounter  Assessment and Plan:  Pregnancy: G3P2002 at [redacted]w[redacted]d 1. Supervision of other normal pregnancy, antepartum  2. Iron deficiency anemia secondary to inadequate dietary iron intake Rx: - ferrous sulfate 325 (65 FE) MG tablet; Take 1 tablet (325 mg total) by mouth 2 (two) times daily with a meal.  Dispense: 60 tablet; Refill: 5  3. Language barrier   Preterm labor symptoms and general obstetric precautions including but not limited to vaginal bleeding, contractions,  leaking of fluid and fetal movement were reviewed in detail with the patient.  I discussed the assessment and treatment plan with the patient. The patient was provided an opportunity to ask questions and all were answered. The patient agreed with the plan and demonstrated an understanding of the instructions. The patient was advised to call back or seek an in-person office evaluation/go to MAU at [facility] for any urgent or concerning symptoms. Please refer to After Visit Summary for other counseling recommendations.   I provided 10 minutes of non-face-to-face time during this encounter.  Return in about 2 weeks (around 01/13/2019) for TeleHealth Virtual.  Future Appointments  Date Time Provider [place]  01/13/2019  3:00 PM Shelly Bombard, MD [place] None    Baltazar Najjar, [place] for [facility], [place] [facility] 12/30/2018

## 2018-12-30 NOTE — Progress Notes (Signed)
Arabic interpreter ID # [id number] used for today's visit.   Pt states she has noticed she is wanting to eat more ice x 2 weeks.

## 2019-01-13 ENCOUNTER — Encounter: Payer: Self-pay | Admitting: Obstetrics

## 2019-01-13 ENCOUNTER — Ambulatory Visit (INDEPENDENT_AMBULATORY_CARE_PROVIDER_SITE_OTHER): Payer: Medicaid Other | Admitting: Obstetrics

## 2019-01-13 VITALS — BP 118/73 | HR 96

## 2019-01-13 DIAGNOSIS — Z789 Other specified health status: Secondary | ICD-10-CM | POA: Diagnosis not present

## 2019-01-13 DIAGNOSIS — Z3493 Encounter for supervision of normal pregnancy, unspecified, third trimester: Secondary | ICD-10-CM

## 2019-01-13 DIAGNOSIS — Z3490 Encounter for supervision of normal pregnancy, unspecified, unspecified trimester: Secondary | ICD-10-CM

## 2019-01-13 DIAGNOSIS — Z3A34 34 weeks gestation of pregnancy: Secondary | ICD-10-CM

## 2019-01-13 DIAGNOSIS — D508 Other iron deficiency anemias: Secondary | ICD-10-CM | POA: Diagnosis not present

## 2019-01-13 MED ORDER — PRENATAL PLUS 27-1 MG PO TABS: 1.0000 | ORAL_TABLET | Freq: Every day | ORAL | 3 refills | Status: DC

## 2019-01-13 NOTE — Progress Notes (Signed)
I connected with  Brynne Kneebone on 01/13/19 by a video enabled telemedicine application and verified that I am speaking with the correct person using two identifiers.  ROB, speaks Arabic. Interpreter [phone].

## 2019-01-13 NOTE — Progress Notes (Signed)
   TELEHEALTH VIRTUAL OBSTETRICS VISIT ENCOUNTER NOTE  I connected with Brittney Cox on 01/13/19 at  3:00 PM EDT by telephone at home and verified that I am speaking with the correct person using two identifiers.   I discussed the limitations, risks, security and privacy concerns of performing an evaluation and management service by telephone and the availability of in person appointments. I also discussed with the patient that there may be a patient responsible charge related to this service. The patient expressed understanding and agreed to proceed.  Subjective:  Brittney Cox is a 28 y.o. G3P2002 at [redacted]w[redacted]d being followed for ongoing prenatal care.  She is currently monitored for the following issues for this low-risk pregnancy and has Encounter for supervision of normal pregnancy, unspecified, unspecified trimester; Language barrier; and Abnormal antenatal AFP screen on their problem list.  Patient reports no complaints. Reports fetal movement. Denies any contractions, bleeding or leaking of fluid.   The following portions of the patient's history were reviewed and updated as appropriate: allergies, current medications, past family history, past medical history, past social history, past surgical history and problem list.   Objective:   General:  Alert, oriented and cooperative.   Mental Status: Normal mood and affect perceived. Normal judgment and thought content.  Rest of physical exam deferred due to type of encounter  Assessment and Plan:  Pregnancy: G3P2002 at [redacted]w[redacted]d 1. Encounter for supervision of normal pregnancy, antepartum, unspecified gravidity   2. Language barrier  3. Iron deficiency anemia secondary to inadequate dietary iron intake Rx: - prenatal vitamin w/FE, FA (PRENATAL 1 + 1) 27-1 MG TABS tablet; Take 1 tablet by mouth daily before breakfast.  Dispense: 90 tablet; Refill: 3 - taking iron   Preterm labor symptoms and general obstetric precautions including but  not limited to vaginal bleeding, contractions, leaking of fluid and fetal movement were reviewed in detail with the patient.  I discussed the assessment and treatment plan with the patient. The patient was provided an opportunity to ask questions and all were answered. The patient agreed with the plan and demonstrated an understanding of the instructions. The patient was advised to call back or seek an in-person office evaluation/go to MAU at [facility] for any urgent or concerning symptoms. Please refer to After Visit Summary for other counseling recommendations.   I provided 15 minutes of non-face-to-face time during this encounter.  Return in about 2 weeks (around 01/27/2019) for ROB in person  Brittney.Marland Cox   Brittney Najjar, MD Center for [place], [place].  [place] 01/13/2019

## 2019-01-27 ENCOUNTER — Other Ambulatory Visit (HOSPITAL_COMMUNITY)
Admission: RE | Admit: 2019-01-27 | Discharge: 2019-01-27 | Disposition: A | Discharge status: Home / Self Care | Payer: Medicaid Other | Source: Ambulatory Visit | Attending: Advanced Practice Midwife | Admitting: Advanced Practice Midwife

## 2019-01-27 ENCOUNTER — Ambulatory Visit (INDEPENDENT_AMBULATORY_CARE_PROVIDER_SITE_OTHER): Payer: Medicaid Other | Admitting: Advanced Practice Midwife

## 2019-01-27 ENCOUNTER — Other Ambulatory Visit: Payer: Self-pay

## 2019-01-27 ENCOUNTER — Other Ambulatory Visit: Payer: Self-pay | Admitting: Advanced Practice Midwife

## 2019-01-27 VITALS — BP 120/78 | HR 100 | Wt 171.6 lb

## 2019-01-27 DIAGNOSIS — Z23 Encounter for immunization: Secondary | ICD-10-CM | POA: Diagnosis not present

## 2019-01-27 DIAGNOSIS — Z3480 Encounter for supervision of other normal pregnancy, unspecified trimester: Secondary | ICD-10-CM | POA: Diagnosis not present

## 2019-01-27 DIAGNOSIS — O26843 Uterine size-date discrepancy, third trimester: Secondary | ICD-10-CM

## 2019-01-27 DIAGNOSIS — Z3A36 36 weeks gestation of pregnancy: Secondary | ICD-10-CM

## 2019-01-27 NOTE — Progress Notes (Signed)
   PRENATAL VISIT NOTE  Subjective:  Brittney Cox is a 28 y.o. G3P2002 at [redacted]w[redacted]d being seen today for ongoing prenatal care.  She is currently monitored for the following issues for this low-risk pregnancy and has Encounter for supervision of normal pregnancy, unspecified, unspecified trimester; Language barrier; and Abnormal antenatal AFP screen on their problem list.  Patient reports no complaints.  Contractions: Not present. Vag. Bleeding: None.  Movement: Present. Denies leaking of fluid.   The following portions of the patient's history were reviewed and updated as appropriate: allergies, current medications, past family history, past medical history, past social history, past surgical history and problem list.   Objective:   Vitals:   01/27/19 1421  BP: 120/78  Pulse: 100  Weight: 171 lb 9.6 oz (77.8 kg)    Fetal Status: Fetal Heart Rate (bpm): 134 Fundal Height: 35 cm Movement: Present  Presentation: Vertex  General:  Alert, oriented and cooperative. Patient is in no acute distress.  Skin: Skin is warm and dry. No rash noted.   Cardiovascular: Normal heart rate noted  Respiratory: Normal respiratory effort, no problems with respiration noted  Abdomen: Soft, gravid, appropriate for gestational age.  Pain/Pressure: Present     Pelvic: Cervical exam performed Dilation: Closed Effacement (%): 30 Station: -2  Extremities: Normal range of motion.  Edema: None  Mental Status: Normal mood and affect. Normal behavior. Normal judgment and thought content.   Assessment and Plan:  Pregnancy: G3P2002 at [redacted]w[redacted]d 1. Supervision of other normal pregnancy, antepartum --Anticipatory guidance about next visits/weeks of pregnancy given.  --Reviewed safety, visitor policy, and COVID testing for scheduled IOL or and C/S and for active labor admission.  Reassurance about COVID-19 with young healthy women according to current data. Discussed possible changes to visits, including televisits, that may  occur due to COVID-19.  The office remains open if pt needs to be seen and MAU is open 24 hours/day for OB emergencies.  - Cervicovaginal ancillary only( [place]) - Strep Gp B NAA  2. Uterine size date discrepancy pregnancy, third trimester --FH 34.5 today, fetal position over to pt right, likely contributing to measurement.   --EFW 46%tile at 31 weeks.  Pt reports her other babies were medium sized and she expresses concern that her belly feels smaller this time.   --F/U ultrasound for growth for reassurance. - [place] MFM OB FOLLOW UP; Future  Term labor symptoms and general obstetric precautions including but not limited to vaginal bleeding, contractions, leaking of fluid and fetal movement were reviewed in detail with the patient. Please refer to After Visit Summary for other counseling recommendations.   Return in about 2 weeks (around 02/10/2019).  Future Appointments  Date Time Provider [place]  02/10/2019  2:35 PM [place], Kathie Dike, CNM [facility] None    Fatima Blank, CNM

## 2019-01-27 NOTE — Progress Notes (Addendum)
ROB/GBS.  Reports no problems today.  TDAP- LD/ FLU- LD tolerated well

## 2019-01-27 NOTE — Patient Instructions (Signed)
Reasons to return to MAU at [facility]:  1.  Contractions are  5 minutes apart or less, each last 1 minute, these have been going on for 1-2 hours, and you cannot walk or talk during them 2.  You have a large gush of fluid, or a trickle of fluid that will not stop and you have to wear a pad 3.  You have bleeding that is bright red, heavier than spotting--like menstrual bleeding (spotting can be normal in early labor or after a check of your cervix) 4.  You do not feel the baby moving like he/she normally does  

## 2019-01-29 LAB — STREP GP B NAA: Strep Gp B NAA: NEGATIVE

## 2019-01-29 LAB — CERVICOVAGINAL ANCILLARY ONLY
Chlamydia: NEGATIVE
Neisseria Gonorrhea: NEGATIVE

## 2019-02-06 ENCOUNTER — Ambulatory Visit (HOSPITAL_COMMUNITY)
Admission: RE | Admit: 2019-02-06 | Discharge: 2019-02-06 | Disposition: A | Discharge status: Home / Self Care | Payer: Medicaid Other | Source: Ambulatory Visit | Attending: Advanced Practice Midwife | Admitting: Advanced Practice Midwife

## 2019-02-06 ENCOUNTER — Other Ambulatory Visit: Payer: Self-pay

## 2019-02-06 DIAGNOSIS — O289 Unspecified abnormal findings on antenatal screening of mother: Secondary | ICD-10-CM | POA: Diagnosis not present

## 2019-02-06 DIAGNOSIS — Z362 Encounter for other antenatal screening follow-up: Secondary | ICD-10-CM

## 2019-02-06 DIAGNOSIS — Z3A38 38 weeks gestation of pregnancy: Secondary | ICD-10-CM | POA: Diagnosis not present

## 2019-02-06 DIAGNOSIS — O26843 Uterine size-date discrepancy, third trimester: Secondary | ICD-10-CM

## 2019-02-10 ENCOUNTER — Ambulatory Visit (INDEPENDENT_AMBULATORY_CARE_PROVIDER_SITE_OTHER): Payer: Medicaid Other | Admitting: Advanced Practice Midwife

## 2019-02-10 VITALS — BP 119/78 | HR 100

## 2019-02-10 DIAGNOSIS — Z3480 Encounter for supervision of other normal pregnancy, unspecified trimester: Secondary | ICD-10-CM

## 2019-02-10 DIAGNOSIS — Z3A38 38 weeks gestation of pregnancy: Secondary | ICD-10-CM

## 2019-02-10 DIAGNOSIS — O26843 Uterine size-date discrepancy, third trimester: Secondary | ICD-10-CM | POA: Diagnosis not present

## 2019-02-10 DIAGNOSIS — Z789 Other specified health status: Secondary | ICD-10-CM

## 2019-02-10 NOTE — Progress Notes (Signed)
TELEHEALTH OBSTETRICS PRENATAL VIRTUAL VIDEO VISIT ENCOUNTER NOTE  Provider location: [facility] for [facility] [place]   I connected with Brittney Cox on 02/10/19 at  2:35 PM EDT by Webex Video Encounter at home and verified that I am speaking with the correct person using two identifiers.   I discussed the limitations, risks, security and privacy concerns of performing an evaluation and management service virtually and the availability of in person appointments. I also discussed with the patient that there may be a patient responsible charge related to this service. The patient expressed understanding and agreed to proceed. Subjective:  Brittney Cox is a 28 y.o. G3P2002 at [redacted]w[redacted]d being seen today for ongoing prenatal care.  She is currently monitored for the following issues for this low-risk pregnancy and has Encounter for supervision of normal pregnancy, unspecified, unspecified trimester; Language barrier; and Abnormal antenatal AFP screen on their problem list.  Patient reports occasional contractions.  Contractions: Irregular. Vag. Bleeding: None.  Movement: Present. Denies any leaking of fluid.   The following portions of the patient's history were reviewed and updated as appropriate: allergies, current medications, past family history, past medical history, past social history, past surgical history and problem list.   Objective:   Vitals:   02/10/19 1459  BP: 119/78  Pulse: 100    Fetal Status:     Movement: Present     General:  Alert, oriented and cooperative. Patient is in no acute distress.  Respiratory: Normal respiratory effort, no problems with respiration noted  Mental Status: Normal mood and affect. Normal behavior. Normal judgment and thought content.  Rest of physical exam deferred due to type of encounter  Imaging: [place] Mfm Ob Follow Up  Result Date: 02/06/2019 ----------------------------------------------------------------------  OBSTETRICS REPORT                        (Signed Final 02/06/2019 03:51 pm) ---------------------------------------------------------------------- Patient Info  ID #:       [id number]                          D.O.B.:  02-16-1991 (28 yrs)  Name:       Brittney Cox                 Visit Date: 02/06/2019 02:36 pm ---------------------------------------------------------------------- Performed By  Performed By:     Percell Boston          Ref. Address:     Faculty Practice                    RDMS  Attending:        Ma Rings MD         [facility]  Referred By:      Currie Paris NP ---------------------------------------------------------------------- Orders   #  Description  Code         Ordered By   1  [place] MFM OB FOLLOW UP                  [id number]     Ellanor Feuerstein LEFTWICH-                                                        KIRBY  ----------------------------------------------------------------------   #  Order #                    Accession #                 Episode #   1  [id number]                  [id number]                  [id number]  ---------------------------------------------------------------------- Indications   Size-Date Discrepancy (S<D)                    O26.849   [redacted] weeks gestation of pregnancy                Z3A.38   Encounter for other antenatal screening        Z36.2   follow-up   Abnormal biochemical screen for OSB            O28.9   (1:152)  ---------------------------------------------------------------------- Vital Signs                                                 Height:        5'5" ---------------------------------------------------------------------- Fetal Evaluation  Num Of Fetuses:         1  Fetal Heart Rate(bpm):  145  Cardiac Activity:       Observed  Presentation:           Cephalic  Placenta:               Posterior  P. Cord Insertion:      Visualized  Amniotic  Fluid  AFI FV:      Within normal limits  AFI Sum(cm)     %Tile       Largest Pocket(cm)  8.81            17          3.3  RUQ(cm)       RLQ(cm)       LUQ(cm)        LLQ(cm)  2.45          3.3           3.06           0 ---------------------------------------------------------------------- Biometry  BPD:      95.2  mm     G. Age:  38w 6d         88  %    CI:        82.29   %    70 - 86  FL/HC:      20.9   %    20.9 - 22.7  HC:      331.1  mm     G. Age:  37w 5d         22  %    HC/AC:      0.97        0.92 - 1.05  AC:      340.9  mm     G. Age:  38w 0d         65  %    FL/BPD:     72.7   %    71 - 87  FL:       69.2  mm     G. Age:  35w 4d          4  %    FL/AC:      20.3   %    20 - 24  HUM:      60.1  mm     G. Age:  35w 0d         14  %  Est. FW:    3224  gm      7 lb 2 oz     46  % ---------------------------------------------------------------------- OB History  Gravidity:    3         Term:   2        Prem:   0        SAB:   0  TOP:          0       Ectopic:  0        Living: 2 ---------------------------------------------------------------------- Gestational Age  LMP:           34w 0d        Date:  06/13/18                 EDD:   03/20/19  U/S Today:     37w 4d                                        EDD:   02/23/19  Best:          38w 1d     Det. By:  U/S  (10/24/18)          EDD:   02/19/19 ---------------------------------------------------------------------- Anatomy  Cranium:               Appears normal         LVOT:                   Previously seen  Cavum:                 Previously seen        Aortic Arch:            Previously seen  Ventricles:            Appears normal         Ductal Arch:            Previously seen  Choroid Plexus:        Previously seen        Diaphragm:              Previously seen  Cerebellum:  Previously seen        Stomach:                Appears normal, left                                                                         sided  Posterior Fossa:       Previously seen        Abdomen:                Previously seen  Nuchal Fold:           Not applicable (>20    Abdominal Wall:         Previously seen                         wks GA)  Face:                  Orbits and profile     Cord Vessels:           Previously seen                         previously seen  Lips:                  Previously seen        Kidneys:                Appear normal  Palate:                Previously seen        Bladder:                Appears normal  Thoracic:              Appears normal         Spine:                  Previously seen  Heart:                 Previously seen        Upper Extremities:      Previously seen  RVOT:                  Previously seen        Lower Extremities:      Previously seen  Other:  Heels and 5th digit previously seen. ---------------------------------------------------------------------- Cervix Uterus Adnexa  Cervix  Not visualized (advanced GA >24wks)  Uterus  Normal shape and size.  Left Ovary  Not visualized.  Right Ovary  Not visualized.  Cul De Sac  No free fluid seen.  Adnexa  No abnormality visualized. ---------------------------------------------------------------------- Comments  This patient was seen for a follow up growth scan as her  fundal heights have measured less than her dates.  She  denies any other problems with her current pregnancy.  She was informed that the fetal growth and amniotic fluid  level appears appropriate for her gestational age.  Delivery may occur at 39 weeks or greater should there be  any further concerns regarding the fetal growth. ----------------------------------------------------------------------  Johnell Comings, MD Electronically Signed Final Report   02/06/2019 03:51 pm ----------------------------------------------------------------------   Assessment and Plan:  Pregnancy: [id number] at [redacted]w[redacted]d 1. Supervision of other normal pregnancy, antepartum --Pt  reports good fetal movement, denies cramping, LOF, or vaginal bleeding --Anticipatory guidance about next visits/weeks of pregnancy given. --Next visit in person with female provider  2. Uterine size date discrepancy pregnancy, third trimester --Fundal height measured low at last visit, follow up US with MFM wnl --Est. FW:    3224  gm      7 lb 2 oz     46  %  3. Language barrier --Arabic interpreter used for all communication  Term labor symptoms and general obstetric precautions including but not limited to vaginal bleeding, contractions, leaking of fluid and fetal movement were reviewed in detail with the patient. I discussed the assessment and treatment plan with the patient. The patient was provided an opportunity to ask questions and all were answered. The patient agreed with the plan and demonstrated an understanding of the instructions. The patient was advised to call back or seek an in-person office evaluation/go to MAU at [facility] for any urgent or concerning symptoms. Please refer to After Visit Summary for other counseling recommendations.   I provided 10 minutes of face-to-face time during this encounter.  Return in about 1 week (around 02/17/2019).  No future appointments.  Fatima Blank, [place] for [employer], [place]

## 2019-02-16 ENCOUNTER — Inpatient Hospital Stay (HOSPITAL_COMMUNITY)
Admission: AD | Admit: 2019-02-16 | Discharge: 2019-02-17 | DRG: 807 | Disposition: A | Discharge status: Home / Self Care | Payer: Medicaid Other | Attending: Obstetrics and Gynecology | Admitting: Obstetrics and Gynecology

## 2019-02-16 ENCOUNTER — Other Ambulatory Visit: Payer: Self-pay

## 2019-02-16 ENCOUNTER — Encounter (HOSPITAL_COMMUNITY): Payer: Self-pay

## 2019-02-16 DIAGNOSIS — Z3A39 39 weeks gestation of pregnancy: Secondary | ICD-10-CM

## 2019-02-16 DIAGNOSIS — Z789 Other specified health status: Secondary | ICD-10-CM

## 2019-02-16 DIAGNOSIS — Z20828 Contact with and (suspected) exposure to other viral communicable diseases: Secondary | ICD-10-CM | POA: Diagnosis present

## 2019-02-16 DIAGNOSIS — Z3480 Encounter for supervision of other normal pregnancy, unspecified trimester: Secondary | ICD-10-CM

## 2019-02-16 DIAGNOSIS — Z3493 Encounter for supervision of normal pregnancy, unspecified, third trimester: Secondary | ICD-10-CM

## 2019-02-16 DIAGNOSIS — O26893 Other specified pregnancy related conditions, third trimester: Secondary | ICD-10-CM | POA: Diagnosis present

## 2019-02-16 LAB — CBC
HCT: 37.3 % (ref 36.0–46.0)
Hemoglobin: 12.3 g/dL (ref 12.0–15.0)
MCH: 29.7 pg (ref 26.0–34.0)
MCHC: 33 g/dL (ref 30.0–36.0)
MCV: 90.1 fL (ref 80.0–100.0)
Platelets: 160 10*3/uL (ref 150–400)
RBC: 4.14 MIL/uL (ref 3.87–5.11)
RDW: 16.1 % — ABNORMAL HIGH (ref 11.5–15.5)
WBC: 5.7 10*3/uL (ref 4.0–10.5)
nRBC: 0 % (ref 0.0–0.2)

## 2019-02-16 LAB — TYPE AND SCREEN
ABO/RH(D): A POS
Antibody Screen: NEGATIVE

## 2019-02-16 LAB — RPR: RPR Ser Ql: NONREACTIVE

## 2019-02-16 LAB — SARS CORONAVIRUS 2 BY RT PCR (HOSPITAL ORDER, PERFORMED IN ~~LOC~~ HOSPITAL LAB): SARS Coronavirus 2: NEGATIVE

## 2019-02-16 LAB — ABO/RH: ABO/RH(D): A POS

## 2019-02-16 MED ORDER — OXYTOCIN 40 UNITS IN NORMAL SALINE INFUSION - SIMPLE MED
2.5000 [IU]/h | INTRAVENOUS | Status: DC
  Filled 2019-02-16: qty 1000

## 2019-02-16 MED ORDER — ONDANSETRON HCL 4 MG PO TABS: 4.0000 mg | ORAL_TABLET | ORAL | Status: DC | PRN

## 2019-02-16 MED ORDER — COCONUT OIL OIL: 1.0000 "application " | TOPICAL_OIL | Status: DC | PRN

## 2019-02-16 MED ORDER — BENZOCAINE-MENTHOL 20-0.5 % EX AERO
1.0000 "application " | INHALATION_SPRAY | CUTANEOUS | Status: DC | PRN
  Administered 2019-02-16: 1 via TOPICAL
  Filled 2019-02-16: qty 56

## 2019-02-16 MED ORDER — LACTATED RINGERS IV SOLN: 500.0000 mL | INTRAVENOUS | Status: DC | PRN

## 2019-02-16 MED ORDER — SIMETHICONE 80 MG PO CHEW: 80.0000 mg | CHEWABLE_TABLET | ORAL | Status: DC | PRN

## 2019-02-16 MED ORDER — ACETAMINOPHEN 325 MG PO TABS
650.0000 mg | ORAL_TABLET | ORAL | Status: DC | PRN
  Administered 2019-02-16: 650 mg via ORAL
  Filled 2019-02-16: qty 2

## 2019-02-16 MED ORDER — ONDANSETRON HCL 4 MG/2ML IJ SOLN: 4.0000 mg | Freq: Four times a day (QID) | INTRAMUSCULAR | Status: DC | PRN

## 2019-02-16 MED ORDER — DIPHENHYDRAMINE HCL 25 MG PO CAPS: 25.0000 mg | ORAL_CAPSULE | Freq: Four times a day (QID) | ORAL | Status: DC | PRN

## 2019-02-16 MED ORDER — LIDOCAINE HCL (PF) 1 % IJ SOLN: 30.0000 mL | INTRAMUSCULAR | Status: DC | PRN

## 2019-02-16 MED ORDER — DIBUCAINE (PERIANAL) 1 % EX OINT
1.0000 "application " | TOPICAL_OINTMENT | CUTANEOUS | Status: DC | PRN
  Administered 2019-02-16: 1 via RECTAL
  Filled 2019-02-16: qty 28

## 2019-02-16 MED ORDER — PRENATAL MULTIVITAMIN CH
1.0000 | ORAL_TABLET | Freq: Every day | ORAL | Status: DC
  Administered 2019-02-17: 13:00:00 1 via ORAL
  Filled 2019-02-16 (×2): qty 1

## 2019-02-16 MED ORDER — TETANUS-DIPHTH-ACELL PERTUSSIS 5-2.5-18.5 LF-MCG/0.5 IM SUSP: 0.5000 mL | Freq: Once | INTRAMUSCULAR | Status: DC

## 2019-02-16 MED ORDER — WITCH HAZEL-GLYCERIN EX PADS: 1.0000 "application " | MEDICATED_PAD | CUTANEOUS | Status: DC | PRN

## 2019-02-16 MED ORDER — IBUPROFEN 600 MG PO TABS
600.0000 mg | ORAL_TABLET | Freq: Four times a day (QID) | ORAL | Status: DC
  Administered 2019-02-16 – 2019-02-17 (×4): 600 mg via ORAL
  Filled 2019-02-16 (×5): qty 1

## 2019-02-16 MED ORDER — LACTATED RINGERS IV SOLN
INTRAVENOUS | Status: DC
  Administered 2019-02-16: 04:00:00 via INTRAVENOUS

## 2019-02-16 MED ORDER — SOD CITRATE-CITRIC ACID 500-334 MG/5ML PO SOLN: 30.0000 mL | ORAL | Status: DC | PRN

## 2019-02-16 MED ORDER — OXYTOCIN BOLUS FROM INFUSION
500.0000 mL | Freq: Once | INTRAVENOUS | Status: AC
  Administered 2019-02-16: 500 mL via INTRAVENOUS

## 2019-02-16 MED ORDER — FENTANYL CITRATE (PF) 100 MCG/2ML IJ SOLN
100.0000 ug | INTRAMUSCULAR | Status: DC | PRN
  Administered 2019-02-16 (×2): 100 ug via INTRAVENOUS
  Filled 2019-02-16 (×2): qty 2

## 2019-02-16 MED ORDER — SENNOSIDES-DOCUSATE SODIUM 8.6-50 MG PO TABS
2.0000 | ORAL_TABLET | ORAL | Status: DC
  Administered 2019-02-16: 2 via ORAL
  Filled 2019-02-16: qty 2

## 2019-02-16 MED ORDER — ONDANSETRON HCL 4 MG/2ML IJ SOLN: 4.0000 mg | INTRAMUSCULAR | Status: DC | PRN

## 2019-02-16 NOTE — Discharge Summary (Signed)
Postpartum Discharge Summary    Patient Name: Brittney Cox DOB: [birth year] MRN: [id number]  Date of admission: 02/16/2019 Delivering Provider: Danna Hefty   Date of discharge: 02/17/2019  Admitting diagnosis: CTX Intrauterine pregnancy: [redacted]w[redacted]d    Secondary diagnosis:  Active Problems:   Encounter for supervision of normal pregnancy in third trimester  Additional problems: n/a     Discharge diagnosis: Term Pregnancy Delivered                                                                                                Post partum procedures:n/a  Augmentation: AROM  Complications: None  Hospital course:  Onset of Labor With Vaginal Delivery     28y.o. yo [id number] 324w4das admitted in Active Labor on 02/16/2019. Patient had an uncomplicated labor course as follows: AROM with rapid delivery after.  Membrane Rupture Time/Date: 7:22 AM ,02/16/2019   Intrapartum Procedures: Episiotomy: None [1]                                         Lacerations:  1st degree [2];Perineal [11]  Patient had a delivery of a Viable infant. 02/16/2019  Information for the patient's newborn:  AlMahiya, Kercheval0[[id number]]Delivery Method: Vaginal, Spontaneous(Filed from Delivery Summary)     Pateint had an uncomplicated postpartum course.  She is ambulating, tolerating a regular diet, passing flatus, and urinating well. Patient is discharged home in stable condition on 02/17/19.  Delivery time: 7:36 AM    Magnesium Sulfate received: No BMZ received: No Rhophylac:No MMR:No Transfusion:No  Physical exam  Vitals:   02/16/19 1432 02/16/19 1700 02/16/19 2116 02/17/19 0520  BP: 108/68 (!) 103/53 114/71 (!) 98/56  Pulse: 85 68 74 73  Resp: '18 16  18  ' Temp: 97.7 F (36.5 C) 98.7 F (37.1 C) 98.3 F (36.8 C) 98.5 F (36.9 C)  TempSrc: Oral Oral Oral Oral  SpO2:   100%   Weight:      Height:       General: alert, cooperative and no distress Lochia: appropriate Uterine  Fundus: firm Incision: N/A DVT Evaluation: No evidence of DVT seen on physical exam. Labs: Lab Results  Component Value Date   WBC 5.7 02/16/2019   HGB 12.3 02/16/2019   HCT 37.3 02/16/2019   MCV 90.1 02/16/2019   PLT 160 02/16/2019   No flowsheet data found.  Discharge instruction: per After Visit Summary and "Baby and Me Booklet".  After visit meds:  Allergies as of 02/17/2019   No Known Allergies     Medication List    TAKE these medications   ferrous sulfate 325 (65 FE) MG tablet Take 1 tablet (325 mg total) by mouth 2 (two) times daily with a meal.   ibuprofen 800 MG tablet Commonly known as: ADVIL Take 1 tablet (800 mg total) by mouth every 8 (eight) hours as needed.   prenatal vitamin w/FE, FA 27-1 MG Tabs tablet Take 1 tablet by mouth daily before breakfast.  Diet: routine diet  Activity: Advance as tolerated. Pelvic rest for 6 weeks.   Outpatient follow up:4 weeks Follow up Appt: Future Appointments  Date Time Provider [place]  03/16/2019  2:15 PM Leftwich-Kirby, Kathie Dike, CNM [facility] None   Follow up Visit:  Please schedule this patient for Postpartum visit in: 4 weeks with the following provider: Any provider For C/S patients schedule nurse incision check in weeks 2 weeks: no Low risk pregnancy complicated by: n/a Delivery mode:  SVD Anticipated Birth Control:  other/unsure PP Procedures needed: n/a  Schedule Integrated [place] visit: no   Newborn Data: Live born female  Birth Weight:   APGAR: 70, 9  Newborn Delivery   Birth date/time: 02/16/2019 07:36:00 Delivery type: Vaginal, Spontaneous      Baby Feeding: Breast Disposition:home with mother   [id number] used for this encounter   Marcille Buffy DNP, CNM  02/17/19  9:03 AM

## 2019-02-16 NOTE — MAU Note (Signed)
CTX every 10 minutes 9/10 since this morning. +FM. No vaginal bleeding or discharge.

## 2019-02-16 NOTE — Lactation Note (Signed)
This note was copied from a baby's chart. Lactation Consultation Note  Patient Name: Brittney Cox YBOFB'P Date: 02/16/2019 Reason for consult: Term  P3 mother whose infant is now 20 hours old.  Mother breast fed her other two children for 6-7 months each.    Mother requests no female providers allowed in her room.  Arabic interpreter [phone]) used for interpretation.  Baby was asleep in mother's arms when I arrived.  Baby has breast fed three times since delivery and mother states she is having no difficulty with latching.  I did not observe her breasts at this time, however, she informed me that her nipples are everted.  She has had no pain with latching.    Encouraged to feed 8-12 times/24 hours or sooner if baby shows feeding cues.  Reviewed cues with mother.  She is familiar with hand expression and did not wish to review.  Container provided and milk storage times discussed.  Finger feeding demonstrated.  Mother will call for latch assistance as needed.  Mom made aware of O/P services, breastfeeding support groups, community resources, and our phone # for post-discharge questions.   Mother has a single electric breast pump for home use.  She does not have private insurance or participate in the [facility] program.  Father present.  Dr. Earle Gell in room for assessment during my visit.  She updated parents.   Maternal Data Formula Feeding for Exclusion: Yes Reason for exclusion: Mother's choice to formula and breast feed on admission Has patient been taught Hand Expression?: Yes Does the patient have breastfeeding experience prior to this delivery?: Yes  Feeding Feeding Type: Breast Fed  LATCH Score                   Interventions    Lactation Tools Discussed/Used WIC Program: No   Consult Status Consult Status: Follow-up Date: 02/17/19 Follow-up type: In-patient    [name] 02/16/2019, 4:17 PM

## 2019-02-16 NOTE — H&P (Addendum)
LABOR AND DELIVERY ADMISSION HISTORY AND PHYSICAL NOTE  Brittney Cox is a 28 y.o. female G78P2002 with IUP at [redacted]w[redacted]d by 23w ultrasound presenting in active labor.  EFW 3224 (46%) at [redacted]w[redacted]d. She reports positive fetal movement. She denies leakage of fluid or vaginal bleeding. History of two prior pregnancy with EFW of 3.5-4kg without any complications.  Prenatal History/Complications: PNC at [facility] Pregnancy complications:  - Language barrier - Arabic speaker originally from [place]  - Incorrect dating based on LMP, corrected by 23 week ultrasound - Inc risk of OSB however anatomy ultrasound WNL. Based on original incorrect gestational age.  Past Medical History: Past Medical History:  Diagnosis Date  . Medical history non-contributory     Past Surgical History: Past Surgical History:  Procedure Laterality Date  . NO PAST SURGERIES      Obstetrical History: OB History    Gravida  3   Para  2   Term  2   Preterm      AB      Living  2     SAB      TAB      Ectopic      Multiple      Live Births  2           Social History: Social History   Socioeconomic History  . Marital status: Married    Spouse name: Not on file  . Number of children: Not on file  . Years of education: Not on file  . Highest education level: Not on file  Occupational History  . Not on file  Social Needs  . Financial resource strain: Not on file  . Food insecurity    Worry: Not on file    Inability: Not on file  . Transportation needs    Medical: Not on file    Non-medical: Not on file  Tobacco Use  . Smoking status: Never Smoker  . Smokeless tobacco: Never Used  Substance and Sexual Activity  . Alcohol use: Never    Frequency: Never  . Drug use: Never  . Sexual activity: Yes    Partners: Male    Birth control/protection: None  Lifestyle  . Physical activity    Days per week: Not on file    Minutes per session: Not on file  . Stress: Not on file  Relationships  .  Social Herbalist on phone: Not on file    Gets together: Not on file    Attends religious service: Not on file    Active member of club or organization: Not on file    Attends meetings of clubs or organizations: Not on file    Relationship status: Not on file  Other Topics Concern  . Not on file  Social History Narrative  . Not on file    Family History: History reviewed. No pertinent family history.  Allergies: No Known Allergies  Medications Prior to Admission  Medication Sig Dispense Refill Last Dose  . ferrous sulfate 325 (65 FE) MG tablet Take 1 tablet (325 mg total) by mouth 2 (two) times daily with a meal. 60 tablet 5 02/15/2019 at Unknown time  . prenatal vitamin w/FE, FA (PRENATAL 1 + 1) 27-1 MG TABS tablet Take 1 tablet by mouth daily before breakfast. 90 tablet 3 02/15/2019 at Unknown time     Review of Systems  All systems reviewed and negative except as stated in HPI  Physical Exam Blood pressure 120/83, pulse 88,  temperature 98.6 F (37 C), temperature source Oral, resp. rate 19, last menstrual period 06/13/2018. General appearance: alert, oriented, NAD Lungs: normal respiratory effort Heart: regular rate Abdomen: soft, non-tender; gravid, FH appropriate for GA Extremities: No calf swelling or tenderness Presentation: cephalic  Fetal monitoring: baseline rate of 130 bpm, moderate variability, + acels, no decels Uterine activity: q 2-5 mins Dilation: 6 Effacement (%): 50 Station: -2 Exam by:: Suezanne Jacquet, RN  Prenatal labs: ABO, Rh: A/Positive/-- (06/02 1026) Antibody: Negative (06/02 1026) Rubella: 5.59 (06/02 1026) RPR: Non Reactive (07/21 0942)  HBsAg: Negative (06/02 1026)  HIV: Non Reactive (07/21 0942)  GC/Chlamydia: Negative GBS: --Theda Sers (09/15 0302)  2-hr GTT: WNL Genetic screening:  NIPS: low risk, AFP: inc risk for OSB - incorrect due to being based on incorrect gestational age Anatomy US: Normal  Prenatal Transfer Tool   Maternal Diabetes: No Genetic Screening: Abnormal:  Results: Other: inc risk for OSB on mid-trimester screen was inaccurate given it was based on incorrect gestational age Maternal Ultrasounds/Referrals: Normal Fetal Ultrasounds or other Referrals:  None Maternal Substance Abuse:  No Significant Maternal Medications:  None Significant Maternal Lab Results: Group B Strep negative  No results found for this or any previous visit (from the past 24 hour(s)).  Patient Active Problem List   Diagnosis Date Noted  . Abnormal antenatal AFP screen 10/16/2018  . Encounter for supervision of normal pregnancy, unspecified, unspecified trimester 10/14/2018  . Language barrier 10/14/2018    Assessment: Brittney Cox is a 28 y.o. G3P2002 at [redacted]w[redacted]d here for active labor.  #Labor: Expectant management at this time. Plan to recheck in 2.5 hours (at 0630) to evaluate for progression. Can consider pitocin if inadequate contractions or arrest in dilation. #Pain: Desires IV pain meds. Declines epidural. #FWB: Cat I #ID:  GBS neg  Postpartum Planning: Girl / Both / Unsure [x]  TDAP, [x]  Flu  Brittney Cox 02/16/2019, 3:05 AM   I confirm that I have verified the information documented in the resident's note and that I have also personally reperformed the history, physical exam and all medical decision making activities of this service and have verified that all service and findings are accurately documented in this student's note.   , 04/18/2019 02/16/2019 6:14 AM

## 2019-02-17 MED ORDER — IBUPROFEN 800 MG PO TABS: 800.0000 mg | ORAL_TABLET | Freq: Three times a day (TID) | ORAL | 0 refills | Status: DC | PRN

## 2019-02-17 NOTE — Progress Notes (Signed)
[employer] interpreter [id number] used to translate maternal and infant care, feeding, safety and 24 hr newborn screenings.

## 2019-02-17 NOTE — Lactation Note (Signed)
This note was copied from a baby's chart. Lactation Consultation Note:  Mother is a P3 , she is experienced breastfeeding mother. Infant is now 57 hours old .  Arabic interpreter ID #   [id number]  for all teaching on discharge visit.   Dicussed continued cue base feeding and allow for cluster feeding. Advised that cluster feeding to continue and is normal newborn behavior. Encouraged mother to do frequent STS .  Mother to feed infant 8-12 times in 24 hours,  Discussed treatment and prevention of engorgement.    Mother advised to follow up with [facility] services, support group and phone services for breastfeeding questions or concerns.  Mother is aware of available [place] services.       Mother has been exclusively breastfeeding.   Patient Name: Brittney Cox Date: 02/17/2019 Reason for consult: Follow-up assessment   Maternal Data    Feeding Feeding Type: Breast Fed  LATCH Score                   Interventions    Lactation Tools Discussed/Used     Consult Status      Darla Lesches 02/17/2019, 1:41 PM

## 2019-03-16 ENCOUNTER — Encounter: Payer: Self-pay | Admitting: Advanced Practice Midwife

## 2019-03-16 ENCOUNTER — Other Ambulatory Visit: Payer: Self-pay

## 2019-03-16 ENCOUNTER — Ambulatory Visit (INDEPENDENT_AMBULATORY_CARE_PROVIDER_SITE_OTHER): Payer: Medicaid Other | Admitting: Advanced Practice Midwife

## 2019-03-16 VITALS — BP 130/83 | HR 67 | Wt 151.0 lb

## 2019-03-16 DIAGNOSIS — R300 Dysuria: Secondary | ICD-10-CM | POA: Diagnosis not present

## 2019-03-16 DIAGNOSIS — Z1389 Encounter for screening for other disorder: Secondary | ICD-10-CM

## 2019-03-16 DIAGNOSIS — Z3009 Encounter for other general counseling and advice on contraception: Secondary | ICD-10-CM

## 2019-03-16 NOTE — Patient Instructions (Signed)

## 2019-03-16 NOTE — Progress Notes (Signed)
Post Partum Exam  Brittney Cox is a 28 y.o. G62P3003 female who presents for a postpartum visit. She is 4 weeks postpartum following a spontaneous vaginal delivery. I have fully reviewed the prenatal and intrapartum course. The delivery was at 89 gestational weeks.  Anesthesia: none. Postpartum course has been Unremarkable. Baby's course has been Unremarkable. Baby is feeding by bottle - Similac. Bleeding no bleeding. Bowel function is normal. Bladder function is normal. Patient is not sexually active. Contraception method is none. Postpartum depression screening:neg EPDS=0  *Patient does NOT want female student in Exam Room.*   The following portions of the patient's history were reviewed and updated as appropriate: allergies, current medications, past family history, past medical history, past social history, past surgical history and problem list. Last pap smear done 10/14/2018 and was Normal  Review of Systems Pertinent items noted in HPI and remainder of comprehensive ROS otherwise negative.    Objective:  Blood pressure 130/83, pulse 67, weight 151 lb (68.5 kg), last menstrual period 06/13/2018, currently breastfeeding.  VS reviewed, nursing note reviewed,  Constitutional: well developed, well nourished, no distress HEENT: normocephalic CV: normal rate Pulm/chest wall: normal effort Abdomen: soft Neuro: alert and oriented x 3 Skin: warm, dry Psych: affect normal           Assessment/Plan:   1. Dysuria --Pt reports recent dysuria, but states she thinks it was her vitamins.  She has no symptoms now that she stopped taking vitamins. --Will send urine culture today - Urine Culture  2. Postpartum examination following vaginal delivery --Doing well, has good support. Her mother is staying with her now which helps.     3. Encounter for general counseling and advice on contraceptive management Discussed LARCs as most effective forms of birth control.  Discussed benefits/risks of  other methods.  Pt desires condoms for now and Nexplanon at a later time.  Discussed effectiveness of condoms vs Nexplanon.  Pt to call office to schedule contraceptive visit.    Social/emotional concerns:  None.  Contraception: condoms  Follow up in: 1 year or as needed.   Fatima Blank, CNM 3:13 PM

## 2019-03-18 LAB — URINE CULTURE: Organism ID, Bacteria: NO GROWTH

## 2020-05-14 NOTE — L&D Delivery Note (Addendum)
Delivery Note AROM for thick meconium stained fluid at 0144 Soon therafter started pushing  At 1:49 AM a viable and healthy female was delivered via Vaginal, Spontaneous (Presentation: Left Occiput Anterior).  APGAR: 8, 9; weight 7 lb 3.3 oz (3270 g).   Placenta status: Spontaneous, Intact.  Cord: 3 vessels with the following complications: None.    Anesthesia: Local Episiotomy: None Lacerations: 1st degree Suture Repair: 3.0 vicryl rapide Est. Blood Loss (mL):   Mom to postpartum.  Baby to Couplet care / Skin to Skin.  [name] 04/28/2021, 3:15 AM  Please schedule this patient for Postpartum visit in: 4 weeks with the following provider: Any provider In-Person For C/S patients schedule nurse incision check in weeks 2 weeks: no Low risk pregnancy complicated by:  none Delivery mode:  SVD Anticipated Birth Control:  POPs PP Procedures needed:  none   Edinburgh: negative Schedule Integrated BH visit: no  No relevant baby issues

## 2020-09-28 ENCOUNTER — Ambulatory Visit (INDEPENDENT_AMBULATORY_CARE_PROVIDER_SITE_OTHER): Payer: Medicaid Other

## 2020-09-28 ENCOUNTER — Other Ambulatory Visit: Payer: Self-pay

## 2020-09-28 VITALS — BP 121/74 | HR 72

## 2020-09-28 DIAGNOSIS — Z3201 Encounter for pregnancy test, result positive: Secondary | ICD-10-CM | POA: Diagnosis not present

## 2020-09-28 DIAGNOSIS — Z3200 Encounter for pregnancy test, result unknown: Secondary | ICD-10-CM

## 2020-09-28 LAB — POCT URINE PREGNANCY: Preg Test, Ur: POSITIVE — AB

## 2020-09-28 NOTE — Progress Notes (Signed)
Brittney Cox presents today for UPT. She does not  pregnancy complaints obgyn: LMP: 08/05/2020    OBJECTIVE: Appears well, in no apparent distress.  OB History    Gravida  3   Para  3   Term  3   Preterm      AB      Living  3     SAB      IAB      Ectopic      Multiple  0   Live Births  3          Home UPT Result: Positive  In-Office UPT result:positive  I have reviewed the patient's medical, obstetrical, social, and family histories, and medications.   ASSESSMENT: Positive pregnancy test  PLAN Prenatal care to be completed at: [facility] around 10-12 weeks.

## 2020-10-20 ENCOUNTER — Ambulatory Visit: Payer: Medicaid Other

## 2020-10-20 ENCOUNTER — Other Ambulatory Visit: Payer: Self-pay

## 2020-10-20 ENCOUNTER — Ambulatory Visit (INDEPENDENT_AMBULATORY_CARE_PROVIDER_SITE_OTHER): Payer: Medicaid Other

## 2020-10-20 VITALS — BP 117/78 | HR 101 | Ht 65.0 in | Wt 166.0 lb

## 2020-10-20 DIAGNOSIS — Z3492 Encounter for supervision of normal pregnancy, unspecified, second trimester: Secondary | ICD-10-CM | POA: Insufficient documentation

## 2020-10-20 DIAGNOSIS — O3680X0 Pregnancy with inconclusive fetal viability, not applicable or unspecified: Secondary | ICD-10-CM

## 2020-10-20 DIAGNOSIS — Z3A11 11 weeks gestation of pregnancy: Secondary | ICD-10-CM

## 2020-10-20 DIAGNOSIS — Z3481 Encounter for supervision of other normal pregnancy, first trimester: Secondary | ICD-10-CM | POA: Diagnosis not present

## 2020-10-20 DIAGNOSIS — Z3493 Encounter for supervision of normal pregnancy, unspecified, third trimester: Secondary | ICD-10-CM | POA: Insufficient documentation

## 2020-10-20 DIAGNOSIS — O219 Vomiting of pregnancy, unspecified: Secondary | ICD-10-CM

## 2020-10-20 MED ORDER — BLOOD PRESSURE KIT DEVI: 1.0000 | 0 refills | Status: AC

## 2020-10-20 MED ORDER — VITAFOL-OB PO TABS: 1.0000 | ORAL_TABLET | Freq: Every day | ORAL | 11 refills | Status: DC

## 2020-10-20 MED ORDER — DICLEGIS 10-10 MG PO TBEC: 2.0000 | DELAYED_RELEASE_TABLET | Freq: Every day | ORAL | 2 refills | Status: DC

## 2020-10-20 NOTE — Progress Notes (Signed)
New OB Intake  I connected with  Brittney Cox on 10/20/20 at 10:15 AM EDT by in person and verified that I am speaking with the correct person using two identifiers. Nurse is located at [facility] and pt is located at [place].  I discussed the limitations, risks, security and privacy concerns of performing an evaluation and management service by telephone and the availability of in person appointments. I also discussed with the patient that there may be a patient responsible charge related to this service. The patient expressed understanding and agreed to proceed.  I explained I am completing New OB Intake today. We discussed her EDD of 05/12/21 that is based on LMP of 08/05/20. Pt is G4/P3003. I reviewed her allergies, medications, Medical/Surgical/OB history, and appropriate screenings. I informed her of [facility] services. Based on history, this is a/an uncomplicated pregnancy.  Patient Active Problem List   Diagnosis Date Noted   Language barrier 10/14/2018    Concerns addressed today  Delivery Plans:  Plans to deliver at [facility].   MyChart/Babyscripts MyChart access verified. I explained pt will have some visits in office and some virtually. Babyscripts instructions given and order placed. Patient verifies receipt of registration text/e-mail. Account successfully created and app downloaded.  Blood Pressure Cuff Blood pressure cuff ordered for patient to pick-up from [employer]. Explained after first prenatal appt pt will check weekly and document in Babyscripts.  Anatomy US Explained first scheduled [place] will be around 19 weeks. Dating and Viability scan performed today. Labs Discussed Avelina Laine genetic screening with patient. Would like both Panorama and Horizon drawn at new OB visit. Routine prenatal labs needed.  Covid Vaccine Patient has not covid vaccine.   Social Determinants of Health Food Insecurity: Patient denies food insecurity. WIC Referral: Patient is interested in  referral to [facility].  Transportation: Patient denies transportation needs. Childcare: Discussed no children allowed at ultrasound appointments. Offered childcare services; patient declines childcare services at this time.  First visit review I reviewed new OB appt with pt. I explained she will have a pelvic exam, ob bloodwork with genetic screening, and PAP smear. Explained pt will be seen by Leroy Libman at first visit; encounter routed to appropriate provider. Explained that patient will be seen by pregnancy navigator following visit with provider.  Hamilton Capri, RN 10/20/2020  10:05 AM

## 2020-11-02 ENCOUNTER — Other Ambulatory Visit (HOSPITAL_COMMUNITY)
Admission: RE | Admit: 2020-11-02 | Discharge: 2020-11-02 | Disposition: A | Discharge status: Home / Self Care | Payer: Medicaid Other | Source: Ambulatory Visit | Attending: Women's Health | Admitting: Women's Health

## 2020-11-02 ENCOUNTER — Other Ambulatory Visit: Payer: Self-pay

## 2020-11-02 ENCOUNTER — Ambulatory Visit (INDEPENDENT_AMBULATORY_CARE_PROVIDER_SITE_OTHER): Payer: Medicaid Other | Admitting: Obstetrics and Gynecology

## 2020-11-02 ENCOUNTER — Encounter: Payer: Self-pay | Admitting: Obstetrics and Gynecology

## 2020-11-02 ENCOUNTER — Encounter: Payer: Medicaid Other | Admitting: Women's Health

## 2020-11-02 VITALS — BP 128/85 | HR 123 | Ht 65.0 in | Wt 171.0 lb

## 2020-11-02 DIAGNOSIS — Z789 Other specified health status: Secondary | ICD-10-CM

## 2020-11-02 DIAGNOSIS — Z3481 Encounter for supervision of other normal pregnancy, first trimester: Secondary | ICD-10-CM | POA: Insufficient documentation

## 2020-11-02 DIAGNOSIS — Z3A12 12 weeks gestation of pregnancy: Secondary | ICD-10-CM

## 2020-11-02 LAB — HEPATITIS C ANTIBODY: HCV Ab: NEGATIVE

## 2020-11-02 NOTE — Progress Notes (Signed)
INITIAL PRENATAL VISIT NOTE  Subjective:  Brittney Cox is a 30 y.o. G4P3003 at 62w5dby LMP being seen today for her initial prenatal visit. This is a planned pregnancy. She and partner are happy with the pregnancy. She has an obstetric history significant for 3 x term SVD. She has a medical history significant for n/a.  Patient reports  occasional cramping .  Contractions: Irritability. Vag. Bleeding: None.   . Denies leaking of fluid.    Past Medical History:  Diagnosis Date   Medical history non-contributory     Past Surgical History:  Procedure Laterality Date   NO PAST SURGERIES      OB History  Gravida Para Term Preterm AB Living  _0 SAB IAB Ectopic Multiple Live Births        0 3    # Outcome Date GA Lbr Len/2nd Weight Sex Delivery Anes PTL Lv  4 Current           3 Term 02/16/19 37w4d5:30 / 00:06 7 lb 8 oz (3.402 kg) F Vag-Spont None  LIV  2 Term 07/13/14    M Vag-Spont   LIV  1 Term 07/04/12    F Vag-Spont   LIV    Social History   Socioeconomic History   Marital status: Married    Spouse name: Not on file   Number of children: Not on file   Years of education: Not on file   Highest education level: Not on file  Occupational History   Not on file  Tobacco Use   Smoking status: Never   Smokeless tobacco: Never  Vaping Use   Vaping Use: Never used  Substance and Sexual Activity   Alcohol use: Never   Drug use: Never   Sexual activity: Yes    Partners: Male    Birth control/protection: None  Other Topics Concern   Not on file  Social History Narrative   Not on file   Social Determinants of Health   Financial Resource Strain: Not on file  Food Insecurity: Not on file  Transportation Needs: Not on file  Physical Activity: Not on file  Stress: Not on file  Social Connections: Not on file    History reviewed. No pertinent family history.   Current Outpatient Medications:    Blood Pressure Monitoring (BLOOD PRESSURE KIT) DEVI,  1 kit by Does not apply route once a week., Disp: 1 each, Rfl: 0   DICLEGIS 10-10 MG TBEC, Take 2 tablets by mouth at bedtime. If symptoms persist, add one tablet in the morning and one in the afternoon, Disp: 100 tablet, Rfl: 2   Prenatal Vit-Fe Fumarate-FA (VITAFOL-OB) TABS, Take 1 tablet by mouth daily., Disp: 30 tablet, Rfl: 11  No Known Allergies  Review of Systems: Negative except for what is mentioned in HPI.  Objective:   Vitals:   11/02/20 1046  BP: 128/85  Pulse: (!) 123  Weight: 171 lb (77.6 kg)   Fetal Status: Fetal Heart Rate (bpm): 163         Physical Exam: BP 128/85   Pulse (!) 123   Wt 171 lb (77.6 kg)   LMP 08/05/2020 (Exact Date)   BMI 28.46 kg/m  CONSTITUTIONAL: Well-developed, well-nourished female in no acute distress.  NEUROLOGIC: Alert and oriented to person, place, and time. Normal reflexes, muscle tone coordination. No cranial nerve deficit noted. PSYCHIATRIC: Normal mood and affect. Normal behavior. Normal judgment and thought content. SKIN:  Skin is warm and dry. No rash noted. Not diaphoretic. No erythema. No pallor. HENT:  Normocephalic, atraumatic, External right and left ear normal. Oropharynx is clear and moist EYES: Conjunctivae and EOM are normal. Pupils are equal, round, and reactive to light. No scleral icterus.  NECK: Normal range of motion, supple, no masses CARDIOVASCULAR: Normal heart rate noted, regular rhythm RESPIRATORY: Effort and breath sounds normal, no problems with respiration noted BREASTS: symmetric,non-tender, no masses palpable ABDOMEN: Soft, nontender, nondistended, gravid. GU: normal appearing external female genitalia,  multiparous normal appearing cervix, scant white discharge in vagina, no lesions noted Bimanual: 13 weeks sized uterus, no adnexal tenderness or palpable lesions noted MUSCULOSKELETAL: Normal range of motion. EXT:  No edema and no tenderness. 2+ distal pulses.   Assessment and Plan:  Pregnancy: G4P3003  at 86w5dby LMP  1. Encounter for supervision of other normal pregnancy in first trimester - [facility], multiple providers, fellows, medical students, virtual visits, MyChart.  - Cervicovaginal ancillary only( [employer]) - Culture, OB Urine - Obstetric Panel, Including HIV - Genetic Screening - Hepatitis C Antibody - [place]FM OB DETAIL +14 WK; Future  2. Language barrier AFish farm managerused  3. [redacted] weeks gestation of pregnancy    Preterm labor symptoms and general obstetric precautions including but not limited to vaginal bleeding, contractions, leaking of fluid and fetal movement were reviewed in detail with the patient.  Please refer to After Visit Summary for other counseling recommendations.   Return in about 4 weeks (around 11/30/2020) for low OB, in person.  KSloan Leiter6/22/2022 11:30 AM

## 2020-11-02 NOTE — Addendum Note (Signed)
Addended by: Charlsie Quest B on: 11/02/2020 11:37 AM   Modules accepted: Orders

## 2020-11-03 LAB — OBSTETRIC PANEL, INCLUDING HIV
Antibody Screen: NEGATIVE
Basophils Absolute: 0 10*3/uL (ref 0.0–0.2)
Basos: 1 %
EOS (ABSOLUTE): 0.1 10*3/uL (ref 0.0–0.4)
Eos: 2 %
HIV Screen 4th Generation wRfx: NONREACTIVE
Hematocrit: 35.3 % (ref 34.0–46.6)
Hemoglobin: 12 g/dL (ref 11.1–15.9)
Hepatitis B Surface Ag: NEGATIVE
Immature Grans (Abs): 0 10*3/uL (ref 0.0–0.1)
Immature Granulocytes: 0 %
Lymphocytes Absolute: 1.3 10*3/uL (ref 0.7–3.1)
Lymphs: 22 %
MCH: 30.1 pg (ref 26.6–33.0)
MCHC: 34 g/dL (ref 31.5–35.7)
MCV: 89 fL (ref 79–97)
Monocytes Absolute: 0.2 10*3/uL (ref 0.1–0.9)
Monocytes: 4 %
Neutrophils Absolute: 4.2 10*3/uL (ref 1.4–7.0)
Neutrophils: 71 %
Platelets: 242 10*3/uL (ref 150–450)
RBC: 3.99 x10E6/uL (ref 3.77–5.28)
RDW: 13.2 % (ref 11.7–15.4)
RPR Ser Ql: NONREACTIVE
Rh Factor: POSITIVE
Rubella Antibodies, IGG: 5.08 index (ref >0.99)
WBC: 5.9 10*3/uL (ref 3.4–10.8)

## 2020-11-03 LAB — CERVICOVAGINAL ANCILLARY ONLY
Chlamydia: NEGATIVE
Comment: NEGATIVE
Comment: NORMAL
Neisseria Gonorrhea: NEGATIVE

## 2020-11-03 LAB — HEPATITIS C ANTIBODY: Hep C Virus Ab: 0.1 s/co ratio (ref 0.0–0.9)

## 2020-11-04 LAB — CULTURE, OB URINE

## 2020-11-04 LAB — URINE CULTURE, OB REFLEX

## 2020-11-16 ENCOUNTER — Encounter: Payer: Self-pay | Admitting: Obstetrics and Gynecology

## 2020-11-21 ENCOUNTER — Encounter: Payer: Self-pay | Admitting: Obstetrics and Gynecology

## 2020-11-24 DIAGNOSIS — O98512 Other viral diseases complicating pregnancy, second trimester: Secondary | ICD-10-CM | POA: Diagnosis not present

## 2020-11-24 DIAGNOSIS — Z3A16 16 weeks gestation of pregnancy: Secondary | ICD-10-CM | POA: Diagnosis not present

## 2020-11-24 DIAGNOSIS — R000 Tachycardia, unspecified: Secondary | ICD-10-CM | POA: Diagnosis not present

## 2020-11-24 DIAGNOSIS — O2342 Unspecified infection of urinary tract in pregnancy, second trimester: Secondary | ICD-10-CM | POA: Diagnosis not present

## 2020-11-24 DIAGNOSIS — O26892 Other specified pregnancy related conditions, second trimester: Secondary | ICD-10-CM | POA: Diagnosis not present

## 2020-11-24 DIAGNOSIS — U071 COVID-19: Secondary | ICD-10-CM | POA: Diagnosis not present

## 2020-11-24 DIAGNOSIS — R8281 Pyuria: Secondary | ICD-10-CM | POA: Diagnosis not present

## 2020-11-24 DIAGNOSIS — Z28310 Unvaccinated for covid-19: Secondary | ICD-10-CM | POA: Diagnosis not present

## 2020-11-30 ENCOUNTER — Encounter: Payer: Medicaid Other | Admitting: Women's Health

## 2020-12-13 ENCOUNTER — Ambulatory Visit (INDEPENDENT_AMBULATORY_CARE_PROVIDER_SITE_OTHER): Payer: Medicaid Other | Admitting: Family Medicine

## 2020-12-13 ENCOUNTER — Other Ambulatory Visit (HOSPITAL_COMMUNITY)
Admission: RE | Admit: 2020-12-13 | Discharge: 2020-12-13 | Disposition: A | Discharge status: Home / Self Care | Payer: Medicaid Other | Source: Ambulatory Visit | Attending: Women's Health | Admitting: Women's Health

## 2020-12-13 VITALS — BP 126/76 | HR 101 | Wt 173.0 lb

## 2020-12-13 DIAGNOSIS — Z3482 Encounter for supervision of other normal pregnancy, second trimester: Secondary | ICD-10-CM

## 2020-12-13 DIAGNOSIS — R300 Dysuria: Secondary | ICD-10-CM | POA: Diagnosis not present

## 2020-12-13 DIAGNOSIS — N898 Other specified noninflammatory disorders of vagina: Secondary | ICD-10-CM | POA: Insufficient documentation

## 2020-12-13 DIAGNOSIS — Z789 Other specified health status: Secondary | ICD-10-CM

## 2020-12-13 DIAGNOSIS — U071 COVID-19: Secondary | ICD-10-CM | POA: Insufficient documentation

## 2020-12-13 DIAGNOSIS — O98512 Other viral diseases complicating pregnancy, second trimester: Secondary | ICD-10-CM

## 2020-12-13 DIAGNOSIS — Z3A18 18 weeks gestation of pregnancy: Secondary | ICD-10-CM

## 2020-12-13 LAB — POCT URINALYSIS DIPSTICK
Appearance: ABNORMAL
Bilirubin, UA: NEGATIVE
Glucose, UA: NEGATIVE
Nitrite, UA: NEGATIVE
Protein, UA: NEGATIVE
Spec Grav, UA: 1.02 (ref 1.010–1.025)
Urobilinogen, UA: 0.2 E.U./dL
pH, UA: 6.5 (ref 5.0–8.0)

## 2020-12-13 LAB — OB RESULTS CONSOLE GC/CHLAMYDIA: Gonorrhea: NEGATIVE

## 2020-12-13 MED ORDER — PREPLUS 27-1 MG PO TABS: 1.0000 | ORAL_TABLET | Freq: Every day | ORAL | 13 refills | Status: DC

## 2020-12-13 NOTE — Patient Instructions (Signed)

## 2020-12-13 NOTE — Progress Notes (Signed)
Pt was diagnosed with Covid on 11/25/20 while visiting her parents. She was given medication to treat, Paxlovid but only took one pill. She had a sore throat and fever for 2 days. States she is feeling much better. She was also treated for UTI sx's which she still complains of today for which she was given macrobid but did not take the medication.

## 2020-12-13 NOTE — Progress Notes (Signed)
PRENATAL VISIT NOTE  Subjective:  Brittney Cox is a 30 y.o. G4P3003 at [redacted]w[redacted]d being seen today for ongoing prenatal care.  She is currently monitored for the following issues for this low-risk pregnancy and has Language barrier and Supervision of normal pregnancy in second trimester on their problem list.  Patient is overall doing well today. Contractions: Not present. Vag. Bleeding: None.  Movement: Increased. Denies leaking of fluid.   She reports recently being diagnosis with COVID when she was visiting family on 7/15. She was prescribed Paxlovid but only took one pill due to concerns about the medication affecting her baby. Her symptoms were low grade fever and sore throat which lasted about two days. She is now feeling much better and denies residual symptoms at this time.  She also states that she has had burning with urination since her last visit. It is mild. She has also had some mild vaginal discharge that is greenish in color. She was diagnosed with a UTI around the same time that she had COVID and was prescribed Macrobid by the provider. She did not take the medication because she was worried about potential side effects on her baby. She would like to know whether she should take the medication and whether any additional tests are needed.  Patient has had some low back pain when she is standing for long periods of time. She is doing self-massage but would like to know if there is anything else she can do to help.  The following portions of the patient's history were reviewed and updated as appropriate: allergies, current medications, past family history, past medical history, past social history, past surgical history and problem list.   Objective:   Vitals:   12/13/20 1317  BP: 126/76  Pulse: (!) 101  Weight: 173 lb (78.5 kg)    Fetal Status: Fetal Heart Rate (bpm): 140s   Movement: Increased     General:  Alert, oriented and cooperative. Patient is in no acute distress.   Skin: Skin is warm and dry. No rash noted.   Cardiovascular: Normal heart rate and regular rhythm.   Respiratory: Normal respiratory effort.  Abdomen: Soft, gravid, appropriate for gestational age.   Pelvic: Cervical exam deferred. Normal vulva with normal appearing vaginal discharge.     Extremities: Normal range of motion.  Edema: None.  Mental Status: Normal mood and affect. Normal behavior. Normal judgment and thought content.   Assessment and Plan:  Pregnancy: G4P3003 at [redacted]w[redacted]d  1. Encounter for supervision of other normal pregnancy in second trimester 2. [redacted] weeks gestation of pregnancy Doing well and progressing as expected. Reviewed low back stretches with patient and encouraged her to try to do these daily. Patient is scheduled for upcoming anatomy scan. AFP ordered today. Due for follow up in 4 weeks. - AFP, Serum, Open Spina Bifida  3. Dysuria Patient continues to have dysuria after recent diagnosis of UTI ~7/15. Prescribed Macrobid but did not take medicine yet. POCT urinalysis with small leukocytes - will send for culture. Advised patient start Macrobid for her symptoms now and reassurance provided about safety in pregnancy. If culture positive - will need test of cure. - POCT urinalysis dipstick - Culture, OB Urine  4. Vaginal discharge Patient reports small amount of light green discharge. No associated vaginal irritation. GC/CT and wet prep obtained. Will follow up results and treat as necessary. - Cervicovaginal ancillary only( [place] [facility])  5. COVID-19 affecting pregnancy in second trimester Recently diagnosed with COVID on 7/15. Symptom  duration of 2 days. Now resolved.   6. Language barrier In-person interpreter present during today's visit.  Preterm labor symptoms and general obstetric precautions including but not limited to vaginal bleeding, contractions, leaking of fluid and fetal movement were reviewed in detail with the patient.  Please refer to After Visit  Summary for other counseling recommendations.   Return in about 4 weeks (around 01/10/2021) for follow up OB visit.  Future Appointments  Date Time Provider Department Center  12/16/2020  2:15 PM [facility]  01/10/2021 10:55 AM Constant, Gigi Gin, MD [facility] None    Worthy Rancher, MD Obstetrics Fellow 12/13/20 2:41 PM

## 2020-12-14 LAB — CERVICOVAGINAL ANCILLARY ONLY
Bacterial Vaginitis (gardnerella): NEGATIVE
Candida Glabrata: NEGATIVE
Candida Vaginitis: NEGATIVE
Chlamydia: NEGATIVE
Comment: NEGATIVE
Comment: NEGATIVE
Comment: NEGATIVE
Comment: NEGATIVE
Comment: NEGATIVE
Comment: NORMAL
Neisseria Gonorrhea: NEGATIVE
Trichomonas: NEGATIVE

## 2020-12-15 LAB — AFP, SERUM, OPEN SPINA BIFIDA
AFP MoM: 0.96
AFP Value: 42.1 ng/mL
Gest. Age on Collection Date: 18.4 weeks
Maternal Age At EDD: 32.9 yr
OSBR Risk 1 IN: 10000
Test Results:: NEGATIVE
Weight: 173 [lb_av]

## 2020-12-15 LAB — CULTURE, OB URINE

## 2020-12-15 LAB — URINE CULTURE, OB REFLEX

## 2020-12-16 ENCOUNTER — Other Ambulatory Visit: Payer: Self-pay

## 2020-12-16 ENCOUNTER — Ambulatory Visit: Payer: Medicaid Other | Attending: Obstetrics and Gynecology

## 2020-12-16 ENCOUNTER — Other Ambulatory Visit: Payer: Self-pay | Admitting: *Deleted

## 2020-12-16 ENCOUNTER — Other Ambulatory Visit: Payer: Self-pay | Admitting: Obstetrics and Gynecology

## 2020-12-16 DIAGNOSIS — Z3689 Encounter for other specified antenatal screening: Secondary | ICD-10-CM

## 2020-12-16 DIAGNOSIS — Z3481 Encounter for supervision of other normal pregnancy, first trimester: Secondary | ICD-10-CM | POA: Insufficient documentation

## 2020-12-16 DIAGNOSIS — Z362 Encounter for other antenatal screening follow-up: Secondary | ICD-10-CM

## 2020-12-16 DIAGNOSIS — Z3A19 19 weeks gestation of pregnancy: Secondary | ICD-10-CM

## 2021-01-10 ENCOUNTER — Encounter: Payer: Self-pay | Admitting: Obstetrics and Gynecology

## 2021-01-10 ENCOUNTER — Ambulatory Visit (INDEPENDENT_AMBULATORY_CARE_PROVIDER_SITE_OTHER): Payer: Medicaid Other | Admitting: Obstetrics and Gynecology

## 2021-01-10 ENCOUNTER — Other Ambulatory Visit: Payer: Self-pay

## 2021-01-10 VITALS — BP 118/73 | HR 96 | Wt 174.0 lb

## 2021-01-10 DIAGNOSIS — Z789 Other specified health status: Secondary | ICD-10-CM

## 2021-01-10 DIAGNOSIS — Z3482 Encounter for supervision of other normal pregnancy, second trimester: Secondary | ICD-10-CM

## 2021-01-10 NOTE — Progress Notes (Signed)
   PRENATAL VISIT NOTE  Subjective:  Brittney Cox is a 30 y.o. G4P3003 at [redacted]w[redacted]d being seen today for ongoing prenatal care.  She is currently monitored for the following issues for this low-risk pregnancy and has Language barrier; Supervision of normal pregnancy in second trimester; and COVID-19 affecting pregnancy in second trimester on their problem list.  Patient reports no complaints.  Contractions: Not present. Vag. Bleeding: None.  Movement: Present. Denies leaking of fluid.   The following portions of the patient's history were reviewed and updated as appropriate: allergies, current medications, past family history, past medical history, past social history, past surgical history and problem list.   Objective:   Vitals:   01/10/21 1110  BP: 118/73  Pulse: 96  Weight: 174 lb (78.9 kg)    Fetal Status: Fetal Heart Rate (bpm): 143 Fundal Height: 24 cm Movement: Present     General:  Alert, oriented and cooperative. Patient is in no acute distress.  Skin: Skin is warm and dry. No rash noted.   Cardiovascular: Normal heart rate noted  Respiratory: Normal respiratory effort, no problems with respiration noted  Abdomen: Soft, gravid, appropriate for gestational age.  Pain/Pressure: Absent     Pelvic: Cervical exam deferred        Extremities: Normal range of motion.  Edema: None  Mental Status: Normal mood and affect. Normal behavior. Normal judgment and thought content.   Assessment and Plan:  Pregnancy: G4P3003 at 107w4d 1. Encounter for supervision of other normal pregnancy in second trimester Patient is doing well without complaints Follow up anatomy ultrasound scheduled Third trimester labs and glucola next visit  2. Language barrier Arabic interpreter present  Preterm labor symptoms and general obstetric precautions including but not limited to vaginal bleeding, contractions, leaking of fluid and fetal movement were reviewed in detail with the patient. Please refer to  After Visit Summary for other counseling recommendations.   Return in about 4 weeks (around 02/07/2021) for in person, ROB, Low risk, 2 hr glucola next visit.  Future Appointments  Date Time Provider Department Center  01/13/2021  9:15 AM [facility] NURSE [facility]  01/13/2021  9:30 AM [facility] US3 [facility]    Catalina Antigua, MD

## 2021-01-10 NOTE — Progress Notes (Signed)
+   Fetal movement. No complaint.  

## 2021-01-13 ENCOUNTER — Ambulatory Visit: Payer: Medicaid Other | Admitting: *Deleted

## 2021-01-13 ENCOUNTER — Other Ambulatory Visit: Payer: Self-pay

## 2021-01-13 ENCOUNTER — Ambulatory Visit: Payer: Medicaid Other | Attending: Obstetrics and Gynecology

## 2021-01-13 VITALS — BP 121/64 | HR 94

## 2021-01-13 DIAGNOSIS — Z3482 Encounter for supervision of other normal pregnancy, second trimester: Secondary | ICD-10-CM

## 2021-01-13 DIAGNOSIS — Z3A23 23 weeks gestation of pregnancy: Secondary | ICD-10-CM

## 2021-01-13 DIAGNOSIS — Z362 Encounter for other antenatal screening follow-up: Secondary | ICD-10-CM | POA: Diagnosis not present

## 2021-02-07 ENCOUNTER — Ambulatory Visit (INDEPENDENT_AMBULATORY_CARE_PROVIDER_SITE_OTHER): Payer: Medicaid Other | Admitting: Advanced Practice Midwife

## 2021-02-07 ENCOUNTER — Other Ambulatory Visit: Payer: Medicaid Other

## 2021-02-07 ENCOUNTER — Encounter: Payer: Self-pay | Admitting: Advanced Practice Midwife

## 2021-02-07 ENCOUNTER — Other Ambulatory Visit: Payer: Self-pay

## 2021-02-07 VITALS — BP 115/67 | HR 91 | Wt 181.0 lb

## 2021-02-07 DIAGNOSIS — B372 Candidiasis of skin and nail: Secondary | ICD-10-CM

## 2021-02-07 DIAGNOSIS — Z3482 Encounter for supervision of other normal pregnancy, second trimester: Secondary | ICD-10-CM | POA: Diagnosis not present

## 2021-02-07 DIAGNOSIS — Z3A26 26 weeks gestation of pregnancy: Secondary | ICD-10-CM

## 2021-02-07 DIAGNOSIS — I83811 Varicose veins of right lower extremities with pain: Secondary | ICD-10-CM

## 2021-02-07 DIAGNOSIS — Z789 Other specified health status: Secondary | ICD-10-CM

## 2021-02-07 MED ORDER — OB COMPLETE PETITE 35-5-1-200 MG PO CAPS: 1.0000 | ORAL_CAPSULE | Freq: Every day | ORAL | 10 refills | Status: AC

## 2021-02-07 MED ORDER — T.E.D. THIGH LENGTH/M-REGULAR MISC: 1.0000 | Freq: Every day | 0 refills | Status: DC

## 2021-02-07 MED ORDER — NYSTATIN 100000 UNIT/GM EX CREA: 1.0000 "application " | TOPICAL_CREAM | Freq: Two times a day (BID) | CUTANEOUS | 1 refills | Status: DC

## 2021-02-07 NOTE — Progress Notes (Signed)
ROB   2hr GTT   CC: Pain in both legs more in right leg. Notes excessive dry skin (feet).

## 2021-02-07 NOTE — Progress Notes (Signed)
   PRENATAL VISIT NOTE  Subjective:  Brittney Cox is a 30 y.o. G4P3003 at [redacted]w[redacted]d being seen today for ongoing prenatal care.  She is currently monitored for the following issues for this low-risk pregnancy and has Language barrier; Supervision of normal pregnancy in second trimester; and COVID-19 affecting pregnancy in second trimester on their problem list.  Patient reports backache and occasional contractions.  Contractions: Not present. Vag. Bleeding: None.  Movement: Present. Denies leaking of fluid.   The following portions of the patient's history were reviewed and updated as appropriate: allergies, current medications, past family history, past medical history, past social history, past surgical history and problem list.   Objective:   Vitals:   02/07/21 0918  BP: 115/67  Pulse: 91  Weight: 181 lb (82.1 kg)    Fetal Status: Fetal Heart Rate (bpm): 148   Movement: Present     General:  Alert, oriented and cooperative. Patient is in no acute distress.  Skin: Skin is warm and dry. No rash noted.   Cardiovascular: Normal heart rate noted  Respiratory: Normal respiratory effort, no problems with respiration noted  Abdomen: Soft, gravid, appropriate for gestational age.  Pain/Pressure: Absent     Pelvic: Cervical exam deferred        Extremities: Normal range of motion.  Edema: Trace  Mental Status: Normal mood and affect. Normal behavior. Normal judgment and thought content.   Assessment and Plan:  Pregnancy: G4P3003 at [redacted]w[redacted]d 1. Encounter for supervision of other normal pregnancy in second trimester --Anticipatory guidance about next visits/weeks of pregnancy given. --Next visit in 2 weeks  - Glucose Tolerance, 2 Hours w/1 Hour - CBC - RPR - HIV Antibody (routine testing w rflx)  2. Language barrier --Husband translated when needed, offered language line but pt declined  3. [redacted] weeks gestation of pregnancy   4. Candida infection of flexural skin --flaky white  patches in between pt toes - nystatin cream (MYCOSTATIN); Apply 1 application topically 2 (two) times daily.  Dispense: 30 g; Refill: 1  5. Varicose veins of right lower extremity with pain --No evidence of DVT, bilateral calves equal in size --Raised enlarged veins in posterior aspect of knee on pt right leg, tender/painful per pt --Elevate, ice PRN - Elastic Bandages & Supports (T.E.D. THIGH LENGTH/M-REGULAR) MISC; 1 Device by Does not apply route daily.  Dispense: 1 each; Refill: 0   Preterm labor symptoms and general obstetric precautions including but not limited to vaginal bleeding, contractions, leaking of fluid and fetal movement were reviewed in detail with the patient. Please refer to After Visit Summary for other counseling recommendations.   Return in about 2 weeks (around 02/21/2021).  Future Appointments  Date Time Provider Department Center  02/21/2021  3:50 PM Anyanwu, Jethro Bastos, MD [facility] None     Sharen Counter, CNM

## 2021-02-08 LAB — CBC
Hematocrit: 30.8 % — ABNORMAL LOW (ref 34.0–46.6)
Hemoglobin: 10.6 g/dL — ABNORMAL LOW (ref 11.1–15.9)
MCH: 30.6 pg (ref 26.6–33.0)
MCHC: 34.4 g/dL (ref 31.5–35.7)
MCV: 89 fL (ref 79–97)
Platelets: 170 10*3/uL (ref 150–450)
RBC: 3.46 x10E6/uL — ABNORMAL LOW (ref 3.77–5.28)
RDW: 13.2 % (ref 11.7–15.4)
WBC: 5.6 10*3/uL (ref 3.4–10.8)

## 2021-02-08 LAB — RPR: RPR Ser Ql: NONREACTIVE

## 2021-02-08 LAB — GLUCOSE TOLERANCE, 2 HOURS W/ 1HR
Glucose, 1 hour: 130 mg/dL (ref 65–179)
Glucose, 2 hour: 106 mg/dL (ref 65–152)
Glucose, Fasting: 84 mg/dL (ref 65–91)

## 2021-02-08 LAB — HIV ANTIBODY (ROUTINE TESTING W REFLEX): HIV Screen 4th Generation wRfx: NONREACTIVE

## 2021-02-21 ENCOUNTER — Ambulatory Visit (INDEPENDENT_AMBULATORY_CARE_PROVIDER_SITE_OTHER): Payer: Medicaid Other | Admitting: Obstetrics & Gynecology

## 2021-02-21 ENCOUNTER — Other Ambulatory Visit: Payer: Self-pay

## 2021-02-21 VITALS — BP 116/76 | HR 108 | Wt 181.2 lb

## 2021-02-21 DIAGNOSIS — Z3483 Encounter for supervision of other normal pregnancy, third trimester: Secondary | ICD-10-CM

## 2021-02-21 DIAGNOSIS — Z3A28 28 weeks gestation of pregnancy: Secondary | ICD-10-CM

## 2021-02-21 NOTE — Patient Instructions (Signed)
Return to office for any scheduled appointments. Call the office or go to the MAU at [facility] [place] if:  You begin to have strong, frequent contractions  Your water breaks.  Sometimes it is a big gush of fluid, sometimes it is just a trickle that keeps getting your panties wet or running down your legs  You have vaginal bleeding.  It is normal to have a small amount of spotting if your cervix was checked.   You do not feel your baby moving like normal.  If you do not, get something to eat and drink and lay down and focus on feeling your baby move.   If your baby is still not moving like normal, you should call the office or go to MAU.  Any other obstetric concerns.   

## 2021-02-21 NOTE — Progress Notes (Signed)
   PRENATAL VISIT NOTE  Subjective:  Brittney Cox is a 30 y.o. G4P3003 at [redacted]w[redacted]d being seen today for ongoing prenatal care. Arabic interpreter present. She is currently monitored for the following issues for this low-risk pregnancy and has Language barrier; Supervision of normal pregnancy in third trimester; and COVID-19 affecting pregnancy in second trimester on their problem list.  Patient reports no complaints.  Contractions: Not present. Vag. Bleeding: None.  Movement: Present. Denies leaking of fluid.   The following portions of the patient's history were reviewed and updated as appropriate: allergies, current medications, past family history, past medical history, past social history, past surgical history and problem list.   Objective:   Vitals:   02/21/21 1605  BP: 116/76  Pulse: (!) 108  Weight: 181 lb 3.2 oz (82.2 kg)    Fetal Status: Fetal Heart Rate (bpm): 150 Fundal Height: 29 cm Movement: Present     General:  Alert, oriented and cooperative. Patient is in no acute distress.  Skin: Skin is warm and dry. No rash noted.   Cardiovascular: Normal heart rate noted  Respiratory: Normal respiratory effort, no problems with respiration noted  Abdomen: Soft, gravid, appropriate for gestational age.  Pain/Pressure: Present     Pelvic: Cervical exam deferred        Extremities: Normal range of motion.  Edema: Trace  Mental Status: Normal mood and affect. Normal behavior. Normal judgment and thought content.   Labs: Results for orders placed or performed in visit on 02/07/21 (from the past 672 hour(s))  Glucose Tolerance, 2 Hours w/1 Hour   Collection Time: 02/07/21  9:14 AM  Result Value Ref Range   Glucose, Fasting 84 65 - 91 mg/dL   Glucose, 1 hour 458 65 - 179 mg/dL   Glucose, 2 hour 099 65 - 152 mg/dL  CBC   Collection Time: 02/07/21  9:14 AM  Result Value Ref Range   WBC 5.6 3.4 - 10.8 x10E3/uL   RBC 3.46 (L) 3.77 - 5.28 x10E6/uL   Hemoglobin 10.6 (L) 11.1 - 15.9  g/dL   Hematocrit 83.3 (L) 82.5 - 46.6 %   MCV 89 79 - 97 fL   MCH 30.6 26.6 - 33.0 pg   MCHC 34.4 31.5 - 35.7 g/dL   RDW 05.3 97.6 - 73.4 %   Platelets 170 150 - 450 x10E3/uL  RPR   Collection Time: 02/07/21  9:14 AM  Result Value Ref Range   RPR Ser Ql Non Reactive Non Reactive  HIV Antibody (routine testing w rflx)   Collection Time: 02/07/21  9:14 AM  Result Value Ref Range   HIV Screen 4th Generation wRfx Non Reactive Non Reactive    Assessment and Plan:  Pregnancy: G4P3003 at [redacted]w[redacted]d 1. [redacted] weeks gestation of pregnancy 2. Encounter for supervision of other normal pregnancy in third trimester Reviewed reassuring labs with patient. No other concerns. Preterm labor symptoms and general obstetric precautions including but not limited to vaginal bleeding, contractions, leaking of fluid and fetal movement were reviewed in detail with the patient. Please refer to After Visit Summary for other counseling recommendations.   Return in about 2 weeks (around 03/07/2021) for OFFICE OB VISIT (MD or APP).  Future Appointments  Date Time Provider Department Center  03/07/2021  3:30 PM Leftwich-Kirby, Wilmer Floor, CNM [facility] None    Jaynie Collins, MD

## 2021-03-07 ENCOUNTER — Other Ambulatory Visit: Payer: Self-pay

## 2021-03-07 ENCOUNTER — Ambulatory Visit (INDEPENDENT_AMBULATORY_CARE_PROVIDER_SITE_OTHER): Payer: Medicaid Other | Admitting: Advanced Practice Midwife

## 2021-03-07 VITALS — BP 119/75 | HR 116 | Wt 179.2 lb

## 2021-03-07 DIAGNOSIS — Z789 Other specified health status: Secondary | ICD-10-CM

## 2021-03-07 DIAGNOSIS — Z3483 Encounter for supervision of other normal pregnancy, third trimester: Secondary | ICD-10-CM

## 2021-03-07 DIAGNOSIS — Z3A30 30 weeks gestation of pregnancy: Secondary | ICD-10-CM

## 2021-03-07 NOTE — Progress Notes (Signed)
   PRENATAL VISIT NOTE  Subjective:  Brittney Cox is a 30 y.o. G4P3003 at [redacted]w[redacted]d being seen today for ongoing prenatal care.  She is currently monitored for the following issues for this low-risk pregnancy and has Language barrier; Supervision of normal pregnancy in third trimester; and COVID-19 affecting pregnancy in second trimester on their problem list.  Patient reports no complaints.  Contractions: Irritability. Vag. Bleeding: None.  Movement: Present. Denies leaking of fluid.   The following portions of the patient's history were reviewed and updated as appropriate: allergies, current medications, past family history, past medical history, past social history, past surgical history and problem list.   Objective:   Vitals:   03/07/21 1604  BP: 119/75  Pulse: (!) 116  Weight: 179 lb 3.2 oz (81.3 kg)    Fetal Status: Fetal Heart Rate (bpm): 153 Fundal Height: 31 cm Movement: Present     General:  Alert, oriented and cooperative. Patient is in no acute distress.  Skin: Skin is warm and dry. No rash noted.   Cardiovascular: Normal heart rate noted  Respiratory: Normal respiratory effort, no problems with respiration noted  Abdomen: Soft, gravid, appropriate for gestational age.  Pain/Pressure: Absent     Pelvic: Cervical exam deferred        Extremities: Normal range of motion.     Mental Status: Normal mood and affect. Normal behavior. Normal judgment and thought content.   Assessment and Plan:  Pregnancy: G4P3003 at [redacted]w[redacted]d 1. Encounter for supervision of other normal pregnancy in third trimester --Anticipatory guidance about next visits/weeks of pregnancy given. --Pt with concerns about previous perineal repair.  She wants revision at time of delivery. She had repair with first and second baby and reports that healing was good but after her third baby the perineum is more "open" and she would like it how it was before. --Unsure if repair can be done at time of delivery but if  she has similar tear it can be revised.  We could call MD into room after delivery to see if repair can be improved.  Pt would like this if provider is female. --Next visit in 2 weeks  2. Language barrier --Arabic interpreter used for all communication  3. [redacted] weeks gestation of pregnancy   Preterm labor symptoms and general obstetric precautions including but not limited to vaginal bleeding, contractions, leaking of fluid and fetal movement were reviewed in detail with the patient. Please refer to After Visit Summary for other counseling recommendations.   Return in about 2 weeks (around 03/21/2021).  Future Appointments  Date Time Provider Department Center  03/21/2021  4:10 PM Leftwich-Kirby, Wilmer Floor, CNM [facility] None     Sharen Counter, CNM

## 2021-03-21 ENCOUNTER — Other Ambulatory Visit: Payer: Self-pay

## 2021-03-21 ENCOUNTER — Ambulatory Visit (INDEPENDENT_AMBULATORY_CARE_PROVIDER_SITE_OTHER): Payer: Medicaid Other | Admitting: Advanced Practice Midwife

## 2021-03-21 VITALS — BP 127/79 | HR 101 | Wt 180.6 lb

## 2021-03-21 DIAGNOSIS — Z789 Other specified health status: Secondary | ICD-10-CM

## 2021-03-21 DIAGNOSIS — Z3A32 32 weeks gestation of pregnancy: Secondary | ICD-10-CM

## 2021-03-21 DIAGNOSIS — Z3483 Encounter for supervision of other normal pregnancy, third trimester: Secondary | ICD-10-CM

## 2021-03-21 NOTE — Progress Notes (Signed)
   PRENATAL VISIT NOTE  Subjective:  Brittney Cox is a 30 y.o. G4P3003 at [redacted]w[redacted]d being seen today for ongoing prenatal care.  She is currently monitored for the following issues for this low-risk pregnancy and has Language barrier; Supervision of normal pregnancy in third trimester; and COVID-19 affecting pregnancy in second trimester on their problem list.  Patient reports occasional contractions.  Contractions: Irritability. Vag. Bleeding: None.  Movement: Present. Denies leaking of fluid.   The following portions of the patient's history were reviewed and updated as appropriate: allergies, current medications, past family history, past medical history, past social history, past surgical history and problem list.   Objective:   Vitals:   03/21/21 1606  BP: 127/79  Pulse: (!) 101  Weight: 180 lb 9.6 oz (81.9 kg)    Fetal Status: Fetal Heart Rate (bpm): 155 Fundal Height: 33 cm Movement: Present     General:  Alert, oriented and cooperative. Patient is in no acute distress.  Skin: Skin is warm and dry. No rash noted.   Cardiovascular: Normal heart rate noted  Respiratory: Normal respiratory effort, no problems with respiration noted  Abdomen: Soft, gravid, appropriate for gestational age.  Pain/Pressure: Present     Pelvic: Cervical exam deferred        Extremities: Normal range of motion.  Edema: None  Mental Status: Normal mood and affect. Normal behavior. Normal judgment and thought content.   Assessment and Plan:  Pregnancy: G4P3003 at [redacted]w[redacted]d 1. Encounter for supervision of other normal pregnancy in third trimester --Anticipatory guidance about next visits/weeks of pregnancy given. --Next appt in 2 weeks  2. Language barrier --Arabic interpreter used for all communication  3. [redacted] weeks gestation of pregnancy   Preterm labor symptoms and general obstetric precautions including but not limited to vaginal bleeding, contractions, leaking of fluid and fetal movement were  reviewed in detail with the patient. Please refer to After Visit Summary for other counseling recommendations.   Return in about 2 weeks (around 04/04/2021).  Future Appointments  Date Time Provider Department Center  04/04/2021  4:10 PM Leftwich-Kirby, Wilmer Floor, CNM [facility] None     Sharen Counter, CNM

## 2021-03-21 NOTE — Progress Notes (Signed)
Patient presents for ROB. Patient has no concerns today. 

## 2021-04-04 ENCOUNTER — Other Ambulatory Visit: Payer: Self-pay

## 2021-04-04 ENCOUNTER — Ambulatory Visit (INDEPENDENT_AMBULATORY_CARE_PROVIDER_SITE_OTHER): Payer: Medicaid Other | Admitting: Advanced Practice Midwife

## 2021-04-04 VITALS — BP 115/69 | HR 101 | Wt 182.0 lb

## 2021-04-04 DIAGNOSIS — Z3483 Encounter for supervision of other normal pregnancy, third trimester: Secondary | ICD-10-CM

## 2021-04-04 DIAGNOSIS — Z3A34 34 weeks gestation of pregnancy: Secondary | ICD-10-CM

## 2021-04-04 DIAGNOSIS — B372 Candidiasis of skin and nail: Secondary | ICD-10-CM

## 2021-04-04 DIAGNOSIS — Z789 Other specified health status: Secondary | ICD-10-CM

## 2021-04-04 MED ORDER — NYSTATIN 100000 UNIT/GM EX CREA: 1.0000 "application " | TOPICAL_CREAM | Freq: Two times a day (BID) | CUTANEOUS | 1 refills | Status: AC

## 2021-04-04 NOTE — Progress Notes (Signed)
   PRENATAL VISIT NOTE  Subjective:  Brittney Cox is a 30 y.o. G4P3003 at [redacted]w[redacted]d being seen today for ongoing prenatal care.  She is currently monitored for the following issues for this low-risk pregnancy and has Language barrier; Supervision of normal pregnancy in third trimester; and COVID-19 affecting pregnancy in second trimester on their problem list.  Patient reports  irritated skin of groin area .  Contractions: Irritability. Vag. Bleeding: None.  Movement: Present. Denies leaking of fluid.   The following portions of the patient's history were reviewed and updated as appropriate: allergies, current medications, past family history, past medical history, past social history, past surgical history and problem list.   Objective:   Vitals:   04/04/21 1555  BP: 115/69  Pulse: (!) 101  Weight: 182 lb (82.6 kg)    Fetal Status: Fetal Heart Rate (bpm): 159   Movement: Present     General:  Alert, oriented and cooperative. Patient is in no acute distress.  Skin: Skin is warm and dry. No rash noted.   Cardiovascular: Normal heart rate noted  Respiratory: Normal respiratory effort, no problems with respiration noted  Abdomen: Soft, gravid, appropriate for gestational age.  Pain/Pressure: Present     Pelvic: Cervical exam deferred        Extremities: Normal range of motion.  Edema: None  Mental Status: Normal mood and affect. Normal behavior. Normal judgment and thought content.   Assessment and Plan:  Pregnancy: G4P3003 at [redacted]w[redacted]d 1. Encounter for supervision of other normal pregnancy in third trimester --Anticipatory guidance about next visits/weeks of pregnancy given. --Next appt in 2 weeks for GBS  2. Language barrier --Arabic interpreter used for all communication  3. [redacted] weeks gestation of pregnancy  4. Skin candidiasis --Skin of groin, at edge of underwear with erythema, mild edema, slight flaking and with small satellite lesions, c/w skin candidiasis. Pt reports mild  symptoms x 2 months, worsening recently --Pt to notify office if not improved, consider topical steroid - nystatin cream (MYCOSTATIN); Apply 1 application topically 2 (two) times daily for 7 days.  Dispense: 30 g; Refill: 1   Preterm labor symptoms and general obstetric precautions including but not limited to vaginal bleeding, contractions, leaking of fluid and fetal movement were reviewed in detail with the patient. Please refer to After Visit Summary for other counseling recommendations.   Return in about 2 weeks (around 04/18/2021).  Future Appointments  Date Time Provider Department Center  04/19/2021  4:10 PM Nugent, Odie Sera, NP [facility] None    Sharen Counter, CNM

## 2021-04-19 ENCOUNTER — Encounter: Payer: Medicaid Other | Admitting: Family Medicine

## 2021-04-25 ENCOUNTER — Encounter: Payer: Medicaid Other | Admitting: Obstetrics and Gynecology

## 2021-04-25 NOTE — Progress Notes (Deleted)
   PRENATAL VISIT NOTE  Subjective:  Brittney Cox is a 30 y.o. G4P3003 at [redacted]w[redacted]d being seen today for ongoing prenatal care.  She is currently monitored for the following issues for this low-risk pregnancy and has Language barrier; Supervision of normal pregnancy in third trimester; and COVID-19 affecting pregnancy in second trimester on their problem list.  Patient reports {sx:14538}.   .  .   . Denies leaking of fluid.   The following portions of the patient's history were reviewed and updated as appropriate: allergies, current medications, past family history, past medical history, past social history, past surgical history and problem list.   Objective:  There were no vitals filed for this visit.  Fetal Status:           General:  Alert, oriented and cooperative. Patient is in no acute distress.  Skin: Skin is warm and dry. No rash noted.   Cardiovascular: Normal heart rate noted  Respiratory: Normal respiratory effort, no problems with respiration noted  Abdomen: Soft, gravid, appropriate for gestational age.        Pelvic: {Blank single:19197::"Cervical exam performed in the presence of a chaperone","Cervical exam deferred"}        Extremities: Normal range of motion.     Mental Status: Normal mood and affect. Normal behavior. Normal judgment and thought content.   Assessment and Plan:  Pregnancy: G4P3003 at [redacted]w[redacted]d 1. Language barrier Arabic interpreter used throughout the visit  2. Encounter for supervision of other normal pregnancy in third trimester GBS/GC/CT done today   Term labor symptoms and general obstetric precautions including but not limited to vaginal bleeding, contractions, leaking of fluid and fetal movement were reviewed in detail with the patient. Please refer to After Visit Summary for other counseling recommendations.   No follow-ups on file.  Future Appointments  Date Time Provider Department Center  04/25/2021  8:55 AM Milas Hock, MD [facility] None     Milas Hock, MD

## 2021-04-27 ENCOUNTER — Encounter (HOSPITAL_COMMUNITY): Payer: Self-pay | Admitting: Obstetrics and Gynecology

## 2021-04-27 ENCOUNTER — Inpatient Hospital Stay (EMERGENCY_DEPARTMENT_HOSPITAL)
Admission: AD | Admit: 2021-04-27 | Discharge: 2021-04-27 | Disposition: A | Discharge status: Home / Self Care | Payer: Medicaid Other | Source: Home / Self Care | Attending: Obstetrics & Gynecology | Admitting: Obstetrics & Gynecology

## 2021-04-27 ENCOUNTER — Encounter (HOSPITAL_COMMUNITY): Payer: Self-pay | Admitting: Obstetrics & Gynecology

## 2021-04-27 ENCOUNTER — Other Ambulatory Visit: Payer: Self-pay

## 2021-04-27 ENCOUNTER — Inpatient Hospital Stay (HOSPITAL_COMMUNITY)
Admission: AD | Admit: 2021-04-27 | Discharge: 2021-04-29 | DRG: 807 | Disposition: A | Discharge status: Home / Self Care | Payer: Medicaid Other | Attending: Obstetrics and Gynecology | Admitting: Obstetrics and Gynecology

## 2021-04-27 DIAGNOSIS — Z3483 Encounter for supervision of other normal pregnancy, third trimester: Secondary | ICD-10-CM

## 2021-04-27 DIAGNOSIS — O479 False labor, unspecified: Secondary | ICD-10-CM

## 2021-04-27 DIAGNOSIS — Z3493 Encounter for supervision of normal pregnancy, unspecified, third trimester: Secondary | ICD-10-CM

## 2021-04-27 DIAGNOSIS — O26893 Other specified pregnancy related conditions, third trimester: Secondary | ICD-10-CM | POA: Diagnosis not present

## 2021-04-27 DIAGNOSIS — Z758 Other problems related to medical facilities and other health care: Secondary | ICD-10-CM | POA: Diagnosis present

## 2021-04-27 DIAGNOSIS — Z3A37 37 weeks gestation of pregnancy: Secondary | ICD-10-CM

## 2021-04-27 DIAGNOSIS — Z20822 Contact with and (suspected) exposure to covid-19: Secondary | ICD-10-CM | POA: Diagnosis present

## 2021-04-27 DIAGNOSIS — O471 False labor at or after 37 completed weeks of gestation: Secondary | ICD-10-CM | POA: Insufficient documentation

## 2021-04-27 DIAGNOSIS — Z789 Other specified health status: Secondary | ICD-10-CM | POA: Diagnosis present

## 2021-04-27 LAB — CBC
HCT: 34.6 % — ABNORMAL LOW (ref 36.0–46.0)
Hemoglobin: 11.7 g/dL — ABNORMAL LOW (ref 12.0–15.0)
MCH: 30 pg (ref 26.0–34.0)
MCHC: 33.8 g/dL (ref 30.0–36.0)
MCV: 88.7 fL (ref 80.0–100.0)
Platelets: 146 10*3/uL — ABNORMAL LOW (ref 150–400)
RBC: 3.9 MIL/uL (ref 3.87–5.11)
RDW: 13.4 % (ref 11.5–15.5)
WBC: 6.5 10*3/uL (ref 4.0–10.5)
nRBC: 0 % (ref 0.0–0.2)

## 2021-04-27 MED ORDER — LACTATED RINGERS IV SOLN: INTRAVENOUS | Status: DC

## 2021-04-27 MED ORDER — SOD CITRATE-CITRIC ACID 500-334 MG/5ML PO SOLN: 30.0000 mL | ORAL | Status: DC | PRN

## 2021-04-27 MED ORDER — ONDANSETRON HCL 4 MG/2ML IJ SOLN: 4.0000 mg | Freq: Four times a day (QID) | INTRAMUSCULAR | Status: DC | PRN

## 2021-04-27 MED ORDER — FENTANYL CITRATE (PF) 100 MCG/2ML IJ SOLN
50.0000 ug | INTRAMUSCULAR | Status: DC | PRN
  Administered 2021-04-28: 100 ug via INTRAVENOUS
  Filled 2021-04-27: qty 2

## 2021-04-27 MED ORDER — OXYTOCIN BOLUS FROM INFUSION
333.0000 mL | Freq: Once | INTRAVENOUS | Status: AC
  Administered 2021-04-28: 333 mL via INTRAVENOUS

## 2021-04-27 MED ORDER — OXYCODONE-ACETAMINOPHEN 5-325 MG PO TABS: 2.0000 | ORAL_TABLET | ORAL | Status: DC | PRN

## 2021-04-27 MED ORDER — LIDOCAINE HCL (PF) 1 % IJ SOLN
30.0000 mL | INTRAMUSCULAR | Status: AC | PRN
  Administered 2021-04-28: 30 mL via SUBCUTANEOUS
  Filled 2021-04-27: qty 30

## 2021-04-27 MED ORDER — OXYTOCIN-SODIUM CHLORIDE 30-0.9 UT/500ML-% IV SOLN
2.5000 [IU]/h | INTRAVENOUS | Status: DC
  Filled 2021-04-27: qty 500

## 2021-04-27 MED ORDER — ACETAMINOPHEN 325 MG PO TABS: 650.0000 mg | ORAL_TABLET | ORAL | Status: DC | PRN

## 2021-04-27 MED ORDER — OXYCODONE-ACETAMINOPHEN 5-325 MG PO TABS: 1.0000 | ORAL_TABLET | ORAL | Status: DC | PRN

## 2021-04-27 MED ORDER — LACTATED RINGERS IV SOLN: 500.0000 mL | INTRAVENOUS | Status: DC | PRN

## 2021-04-27 NOTE — MAU Note (Signed)
PT here with back to back ctx. Worse since 6pm.

## 2021-04-27 NOTE — MAU Provider Note (Addendum)
S: RN labor eval, not seen by provider.  Cervical exam:  Dilation: 3.5 Effacement (%): 50 Cervical Position: Middle Station: -3 Presentation: Vertex Exam by:: weston,rn  Fetal Monitoring: Baseline: 135 Variability: Moderate Accelerations: Present  Decelerations: Absent Contractions: Every 4-5 minutes  A: SIUP at [redacted]w[redacted]d  False labor  P: Unchanged on recheck. Cat 1, reactive NST. Stable for discharge with labor precautions.   Trula Slade, MD PGY-2 04/27/2021 1:04 PM   GME ATTESTATION:  I agree with the findings and the plan of care as documented in the residents note. I have made changes to documentation as necessary. I have reviewed the NST and agree that patient is stable for discharge. Labor precautions reviewed.  Evalina Field, MD OB Fellow, Faculty [facility], Center for [facility] 04/27/2021 1:08 PM

## 2021-04-27 NOTE — MAU Note (Signed)
Presents stating she's been having ctxs since yesterday afternoon, reports ctxs are between 5-17 minutes.  Denies VB or LOF.  Endorses +FM.

## 2021-04-27 NOTE — MAU Note (Signed)
1302 discussed with pt per interpreter that I would call the doctor . Pt states contractions are less intense. Instructed pt that plan may be to go home since SVE is unchanged and contractions are not as intense. Pt states she is okay with that. Instructed pt that provider had reviewed tracing.

## 2021-04-27 NOTE — H&P (Signed)
Brittney Cox is a 30 y.o. female presenting for painful contractions.   Was seen earlier today for labor evaluation at 3cm   Contractions are closer and more painful now.  Prior notation discusses patient request to have perineal repair at delivery.  Last delivery, she had a first degreen hemostatic laceration that was left unrepaired.  Patient would like it to be less "open".  . OB History     Gravida  4   Para  3   Term  3   Preterm      AB      Living  3      SAB      IAB      Ectopic      Multiple  0   Live Births  3          Past Medical History:  Diagnosis Date   Medical history non-contributory    Past Surgical History:  Procedure Laterality Date   NO PAST SURGERIES     Family History: family history is not on file. Social History:  reports that she has never smoked. She has never used smokeless tobacco. She reports that she does not drink alcohol and does not use drugs.     Maternal Diabetes: No Genetic Screening: Normal Maternal Ultrasounds/Referrals: Normal Fetal Ultrasounds or other Referrals:  None Maternal Substance Abuse:  No Significant Maternal Medications:  None Significant Maternal Lab Results:  None Other Comments:   GBS had not been done in office yet  Review of Systems  Constitutional:  Negative for chills and fever.  Eyes:  Negative for visual disturbance.  Respiratory:  Negative for shortness of breath.   Gastrointestinal:  Positive for abdominal pain. Negative for diarrhea and nausea.  Genitourinary:  Positive for pelvic pain. Negative for vaginal bleeding.  Neurological:  Negative for weakness.  Maternal Medical History:  Reason for admission: Contractions.  Nausea.  Contractions: Onset was 3-5 hours ago.   Frequency: regular.   Perceived severity is strong.   Fetal activity: Perceived fetal activity is normal.   Last perceived fetal movement was within the past hour.   Prenatal complications: No bleeding, PIH or  placental abnormality.   Prenatal Complications - Diabetes: none.  Dilation: 6 Station: -2 Exam by:: Alondra Twitty RN Blood pressure 120/72, pulse 94, temperature 98.2 F (36.8 C), temperature source Oral, resp. rate 17, last menstrual period 08/05/2020, currently breastfeeding. Maternal Exam:  Uterine Assessment: Contraction strength is firm.  Contraction frequency is regular.  Abdomen: Patient reports no abdominal tenderness. Estimated fetal weight is 7lb.   Fetal presentation: vertex Introitus: Normal vulva. Normal vagina.  Pelvis: adequate for delivery.   Cervix: Cervix evaluated by digital exam.     Fetal Exam Fetal Monitor Review: Mode: ultrasound.   Baseline rate: 145.  Variability: moderate (6-25 bpm).   Pattern: accelerations present and no decelerations.   Fetal State Assessment: Category I - tracings are normal.  Physical Exam Constitutional:      General: She is not in acute distress.    Appearance: She is not ill-appearing or toxic-appearing.  HENT:     Head: Normocephalic.  Cardiovascular:     Rate and Rhythm: Normal rate.  Pulmonary:     Effort: Pulmonary effort is normal.  Abdominal:     General: There is no distension.     Tenderness: There is no abdominal tenderness. There is no guarding or rebound.  Genitourinary:    General: Normal vulva.     Comments: Dilation:  [place]: -2 Presentation: Vertex Exam by:: Ardelle Lesches RN  Musculoskeletal:     Cervical back: Normal range of motion.  Skin:    General: Skin is warm and dry.  Neurological:     General: No focal deficit present.     Mental Status: She is alert.  Psychiatric:        Mood and Affect: Mood normal.    Prenatal labs: ABO, Rh: --/--/PENDING (12/15 2306) Antibody: PENDING (12/15 2306) Rubella: 5.08 (06/22 1148) RPR: Non Reactive (09/27 0914)  HBsAg: Negative (06/22 1148)  HIV: Non Reactive (09/27 0914)  GBS:     Assessment/Plan: Single IUP at [redacted]w[redacted]d Active Labor  Admit to  Labor and Delivery Routine orders Anticipate SVD, will assess for perineal repair at delivery   Hansel Feinstein 04/27/2021, 11:33 PM

## 2021-04-27 NOTE — MAU Note (Signed)
Pt given instructions in Arabic . Discussed sign/symptoms for return with interpreter including worsening contractions, contractions that are closer together than at this time, gushes of water, bleeding, baby not moving like normal. Pt verbalizes under standing per interpreter and states pain is 2-3.

## 2021-04-28 ENCOUNTER — Encounter (HOSPITAL_COMMUNITY): Payer: Self-pay | Admitting: Obstetrics and Gynecology

## 2021-04-28 DIAGNOSIS — Z3A37 37 weeks gestation of pregnancy: Secondary | ICD-10-CM | POA: Diagnosis not present

## 2021-04-28 LAB — TYPE AND SCREEN
ABO/RH(D): A POS
Antibody Screen: NEGATIVE

## 2021-04-28 LAB — RESP PANEL BY RT-PCR (FLU A&B, COVID) ARPGX2
Influenza A by PCR: NEGATIVE
Influenza B by PCR: NEGATIVE
SARS Coronavirus 2 by RT PCR: NEGATIVE

## 2021-04-28 LAB — RPR: RPR Ser Ql: NONREACTIVE

## 2021-04-28 MED ORDER — ONDANSETRON HCL 4 MG/2ML IJ SOLN: 4.0000 mg | INTRAMUSCULAR | Status: DC | PRN

## 2021-04-28 MED ORDER — BENZOCAINE-MENTHOL 20-0.5 % EX AERO
1.0000 "application " | INHALATION_SPRAY | CUTANEOUS | Status: DC | PRN
  Administered 2021-04-28: 1 via TOPICAL
  Filled 2021-04-28: qty 56

## 2021-04-28 MED ORDER — ACETAMINOPHEN 325 MG PO TABS: 650.0000 mg | ORAL_TABLET | ORAL | Status: DC | PRN

## 2021-04-28 MED ORDER — PRENATAL MULTIVITAMIN CH
1.0000 | ORAL_TABLET | Freq: Every day | ORAL | Status: DC
  Administered 2021-04-28 – 2021-04-29 (×2): 1 via ORAL
  Filled 2021-04-28 (×2): qty 1

## 2021-04-28 MED ORDER — WITCH HAZEL-GLYCERIN EX PADS: 1.0000 "application " | MEDICATED_PAD | CUTANEOUS | Status: DC | PRN

## 2021-04-28 MED ORDER — TETANUS-DIPHTH-ACELL PERTUSSIS 5-2.5-18.5 LF-MCG/0.5 IM SUSY: 0.5000 mL | PREFILLED_SYRINGE | Freq: Once | INTRAMUSCULAR | Status: DC

## 2021-04-28 MED ORDER — IBUPROFEN 600 MG PO TABS
600.0000 mg | ORAL_TABLET | Freq: Four times a day (QID) | ORAL | Status: DC
  Administered 2021-04-28 – 2021-04-29 (×6): 600 mg via ORAL
  Filled 2021-04-28 (×6): qty 1

## 2021-04-28 MED ORDER — FENTANYL CITRATE (PF) 100 MCG/2ML IJ SOLN
100.0000 ug | INTRAMUSCULAR | Status: DC | PRN
  Administered 2021-04-28: 100 ug via INTRAVENOUS
  Filled 2021-04-28: qty 2

## 2021-04-28 MED ORDER — DIPHENHYDRAMINE HCL 25 MG PO CAPS: 25.0000 mg | ORAL_CAPSULE | Freq: Four times a day (QID) | ORAL | Status: DC | PRN

## 2021-04-28 MED ORDER — SIMETHICONE 80 MG PO CHEW: 80.0000 mg | CHEWABLE_TABLET | ORAL | Status: DC | PRN

## 2021-04-28 MED ORDER — COCONUT OIL OIL: 1.0000 "application " | TOPICAL_OIL | Status: DC | PRN

## 2021-04-28 MED ORDER — SENNOSIDES-DOCUSATE SODIUM 8.6-50 MG PO TABS
2.0000 | ORAL_TABLET | ORAL | Status: DC
  Administered 2021-04-28 – 2021-04-29 (×2): 2 via ORAL
  Filled 2021-04-28 (×2): qty 2

## 2021-04-28 MED ORDER — DIBUCAINE (PERIANAL) 1 % EX OINT: 1.0000 "application " | TOPICAL_OINTMENT | CUTANEOUS | Status: DC | PRN

## 2021-04-28 MED ORDER — INFLUENZA VAC SPLIT QUAD 0.5 ML IM SUSY: 0.5000 mL | PREFILLED_SYRINGE | INTRAMUSCULAR | Status: DC

## 2021-04-28 MED ORDER — ZOLPIDEM TARTRATE 5 MG PO TABS: 5.0000 mg | ORAL_TABLET | Freq: Every evening | ORAL | Status: DC | PRN

## 2021-04-28 MED ORDER — ONDANSETRON HCL 4 MG PO TABS: 4.0000 mg | ORAL_TABLET | ORAL | Status: DC | PRN

## 2021-04-28 NOTE — Discharge Summary (Signed)
Postpartum Discharge Summary       Patient Name: Brittney Cox DOB: 07-04-1990 MRN: [id number]  Date of admission: 04/27/2021 Delivery date:04/28/2021  Delivering provider: Seabron Spates  Date of discharge: 04/29/2021  Admitting diagnosis: Indication for care in labor and delivery, antepartum [O75.9] Intrauterine pregnancy: [redacted]w[redacted]d    Secondary diagnosis:  Active Problems:   Language barrier   Supervision of normal pregnancy in third trimester   Vaginal delivery  Additional problems: none    Discharge diagnosis: Term Pregnancy Delivered                                              Post partum procedures: None Augmentation: AROM Complications: None  Hospital course: Onset of Labor With Vaginal Delivery      30y.o. yo [id number] admitted in Active Labor on 04/27/2021. Patient had an uncomplicated labor course as follows:  Membrane Rupture Time/Date: 1:44 AM ,04/28/2021   Delivery Method:Vaginal, Spontaneous  Episiotomy: None  Lacerations:  1st degree  Patient had an uncomplicated postpartum course.  She is ambulating, tolerating a regular diet, passing flatus, and urinating well. Patient is discharged home in stable condition on 04/29/21.  Newborn Data: Birth date:04/28/2021  Birth time:1:49 AM  Gender:Female  Living status:Living  Apgars:8 ,9  Weight:3270 g   Magnesium Sulfate received: No BMZ received: No Rhophylac:N/A MMR:N/A T-DaP: declined Flu: declined Transfusion:No  Physical exam  Vitals:   04/28/21 1153 04/28/21 1823 04/28/21 2135 04/29/21 0615  BP: 103/66 121/79 107/69 98/67  Pulse: 88 87 84 80  Resp:   18 16  Temp: 97.9 F (36.6 C) 97.8 F (36.6 C) 98.5 F (36.9 C) 97.9 F (36.6 C)  TempSrc: Oral  Oral Oral  SpO2: 100% 100% 100% 98%   General: alert Lochia: appropriate Uterine Fundus: firm Incision: N/A DVT Evaluation: No evidence of DVT seen on physical exam. Labs: Lab Results  Component Value Date   WBC 6.5  04/27/2021   HGB 11.7 (L) 04/27/2021   HCT 34.6 (L) 04/27/2021   MCV 88.7 04/27/2021   PLT 146 (L) 04/27/2021   No flowsheet data found. Edinburgh Score: Edinburgh Postnatal Depression Scale Screening Tool 04/28/2021  I have been able to laugh and see the funny side of things. (No Data)  I have looked forward with enjoyment to things. -  I have blamed myself unnecessarily when things went wrong. -  I have been anxious or worried for no good reason. -  I have felt scared or panicky for no good reason. -  Things have been getting on top of me. -  I have been so unhappy that I have had difficulty sleeping. -  I have felt sad or miserable. -  I have been so unhappy that I have been crying. -  The thought of harming myself has occurred to me. - Flavia Shipperostnatal Depression Scale Total -      After visit meds:  Allergies as of 04/29/2021   No Known Allergies      Medication List     STOP taking these medications    Diclegis 10-10 MG Tbec Generic drug: Doxylamine-Pyridoxine   T.E.D. Thigh Length/M-Regular Misc       TAKE these medications    acetaminophen 325 MG tablet Commonly known as: Tylenol Take 2 tablets (650 mg total) by mouth every 4 (  four) hours as needed (for pain scale < 4).   Blood Pressure Kit Devi 1 kit by Does not apply route once a week.   ibuprofen 600 MG tablet Commonly known as: ADVIL Take 1 tablet (600 mg total) by mouth every 6 (six) hours.   norethindrone 0.35 MG tablet Commonly known as: Ortho Micronor Take 1 tablet (0.35 mg total) by mouth daily.   OB Complete Petite 35-5-1-200 MG Caps Take 1 capsule by mouth daily.         Please schedule this patient for Postpartum visit in: 4 weeks with the following provider: Any provider In-Person For C/S patients schedule nurse incision check in weeks 2 weeks: no Low risk pregnancy complicated by: none Delivery mode:  SVD Anticipated Birth Control:  POPs PP Procedures needed: none   Edinburgh: negative Schedule Integrated BH visit: no  No relevant baby issues   Discharge home in stable condition Infant Feeding: Breast Infant Disposition:home with mother Discharge instruction: per After Visit Summary and Postpartum booklet. Activity: Advance as tolerated. Pelvic rest for 6 weeks.  Diet: routine diet Anticipated Birth Control: POPs Postpartum Appointment:4 weeks Additional Postpartum F/U:  None Future Appointments: Future Appointments  Date Time Provider [place]  05/01/2021 10:15 AM [place], NP [place] None   Follow up Visit: Message sent to [place] by Dr. Cy Blamer on 12/17   Renard Matter, MD, MPH OB Fellow, Faculty Practice

## 2021-04-29 LAB — CULTURE, BETA STREP (GROUP B ONLY)

## 2021-04-29 MED ORDER — ACETAMINOPHEN 325 MG PO TABS
650.0000 mg | ORAL_TABLET | ORAL | 1 refills | Status: AC | PRN
Start: 2021-04-29 — End: ?

## 2021-04-29 MED ORDER — IBUPROFEN 600 MG PO TABS: 600.0000 mg | ORAL_TABLET | Freq: Four times a day (QID) | ORAL | 1 refills | Status: AC

## 2021-04-29 MED ORDER — NORETHINDRONE 0.35 MG PO TABS: 1.0000 | ORAL_TABLET | Freq: Every day | ORAL | 1 refills | Status: AC

## 2021-05-01 ENCOUNTER — Encounter: Payer: Medicaid Other | Admitting: Nurse Practitioner

## 2021-05-01 ENCOUNTER — Telehealth: Payer: Self-pay

## 2021-05-01 NOTE — Telephone Encounter (Signed)
Transition Care Management Follow-up Telephone Call Date of discharge and from where: 04/29/2021 from [facility] How have you been since you were released from the hospital? Pt and spouse stated that everything is going well. They did not have any questions or concerns at this time.  Any questions or concerns? No  Items Reviewed: Did the pt receive and understand the discharge instructions provided? Yes  Medications obtained and verified? Yes  Other? No  Any new allergies since your discharge? No  Dietary orders reviewed? No Do you have support at home? Yes   Functional Questionnaire: (I = Independent and D = Dependent) ADLs: I  Bathing/Dressing- I  Meal Prep- I  Eating- I  Maintaining continence- I  Transferring/Ambulation- I  Managing Meds- I   Follow up appointments reviewed:  PCP Hospital f/u appt confirmed? No   Specialist Hospital f/u appt confirmed? Yes  Scheduled to see Jeanelle Malling, CNM on 06/13/2020 @ 10:55am. Are transportation arrangements needed? No  If their condition worsens, is the pt aware to call PCP or go to the Emergency Dept.? Yes Was the patient provided with contact information for the PCP's office or ED? Yes Was to pt encouraged to call back with questions or concerns? Yes

## 2021-05-11 ENCOUNTER — Telehealth (HOSPITAL_COMMUNITY): Payer: Self-pay | Admitting: *Deleted

## 2021-05-11 NOTE — Telephone Encounter (Signed)
Phone not answered.  Duffy Rhody, RN 05-11-2021 at 9:24am

## 2021-06-13 ENCOUNTER — Ambulatory Visit: Payer: Medicaid Other | Admitting: Advanced Practice Midwife

## 2021-09-10 IMAGING — US US MFM OB COMP +14 WKS
1 series · 13 of 28 positions shown · non-contrast
Comparison: none

[Series 1: us mfm ob comp +14 wks · 124 acquisitions, 13 frames shown]
[im 5/124]
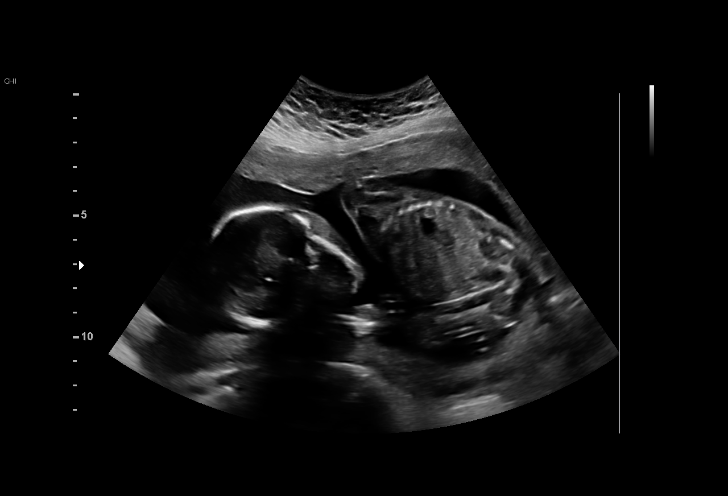
[im 14/124]
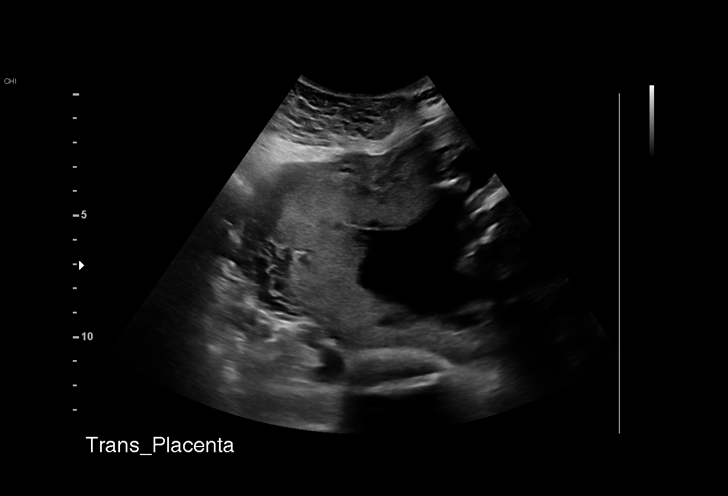
[im 23/124]
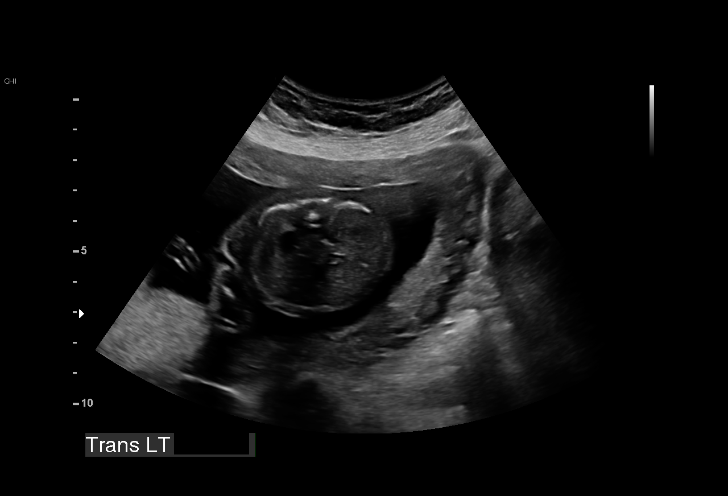
[im 32/124]
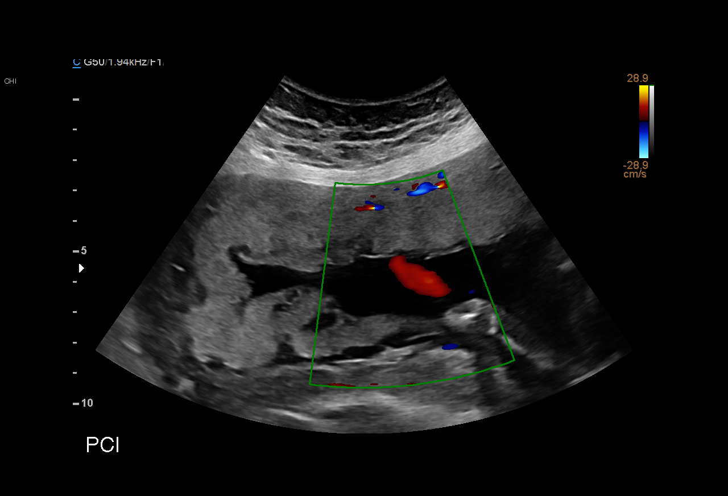
[im 42/124]
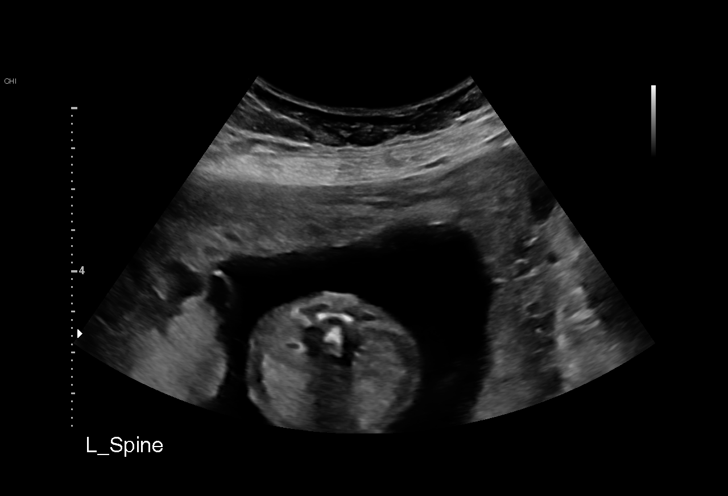
[im 51/124]
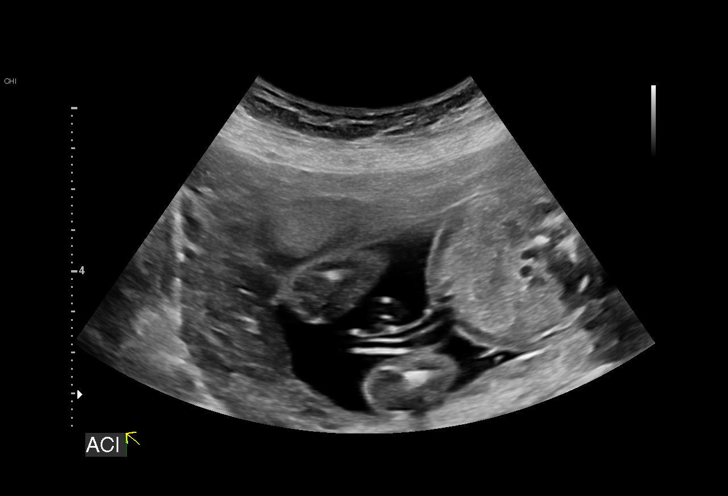
[im 64/124]
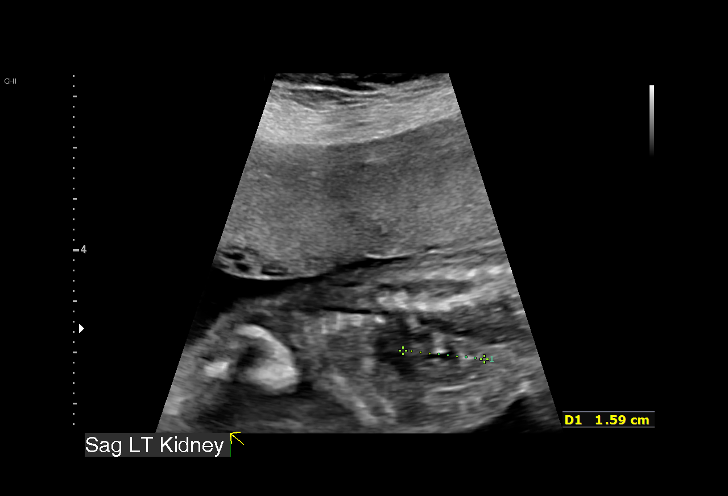
[im 73/124]
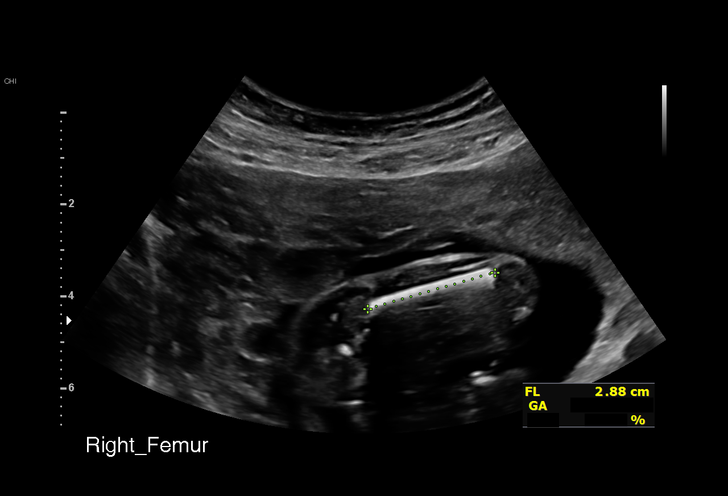
[im 83/124]
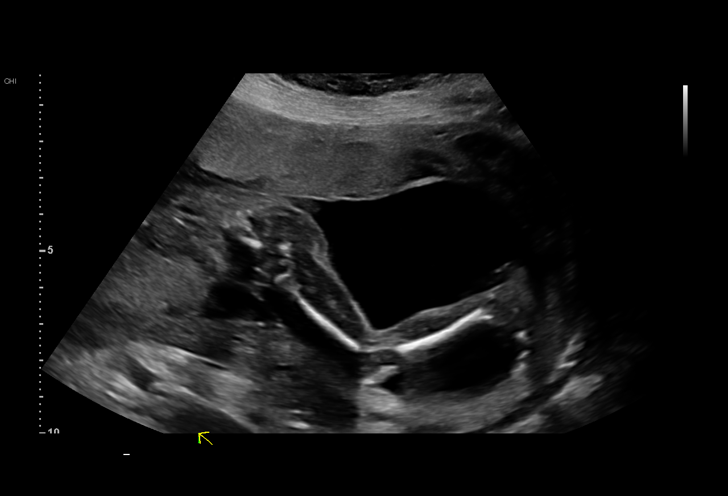
[im 92/124]
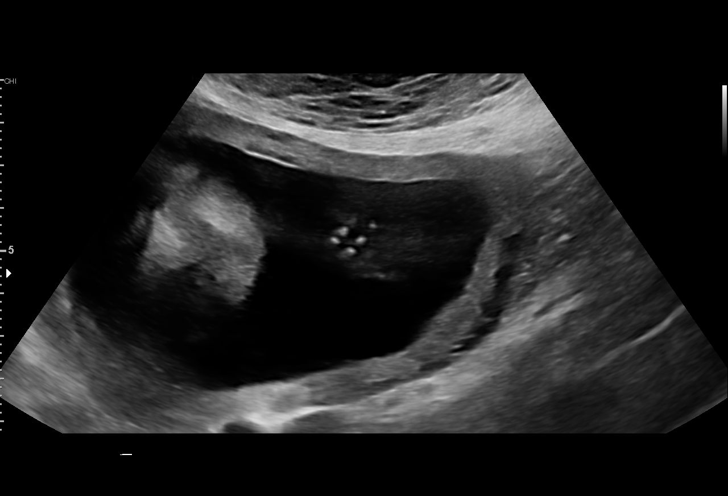
[im 101/124]
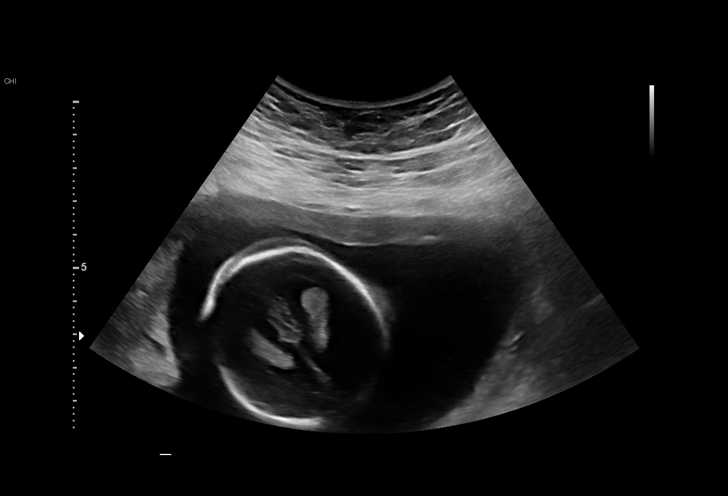
[im 110/124]
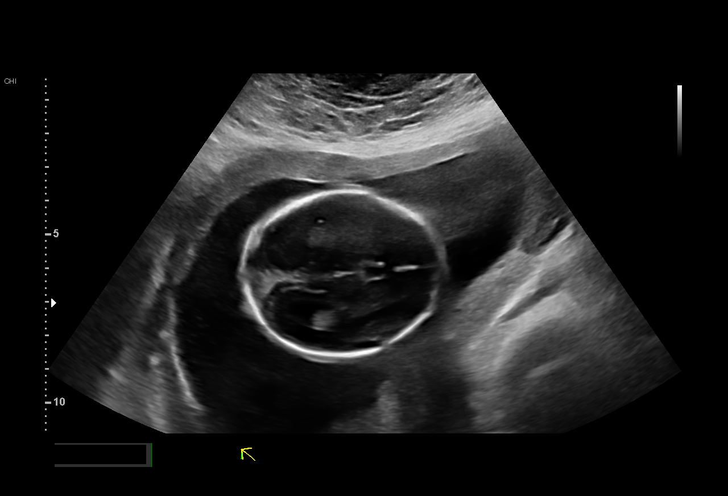
[im 119/124]
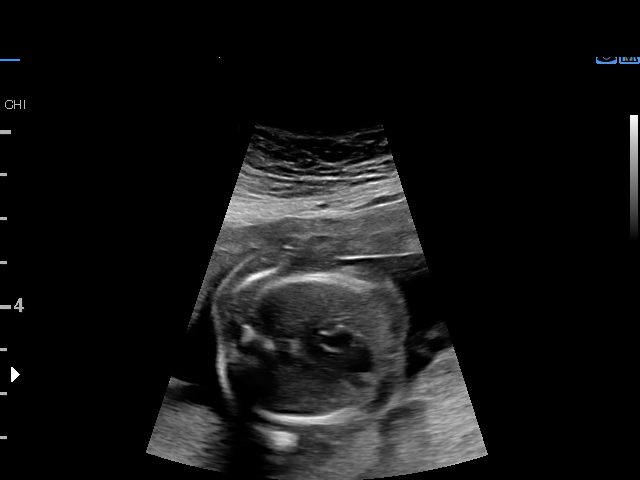

[13 of 28 positions shown; findings below may reference images not displayed]

Indications

 Encounter for antenatal screening for
 malformations
 Negative AFP; LR NIPS (male)
 19 weeks gestation of pregnancy
Fetal Evaluation

 Num Of Fetuses:         1
 Fetal Heart Rate(bpm):  155
 Cardiac Activity:       Observed
 Presentation:           Breech
 Placenta:               Right lateral
 P. Cord Insertion:      Visualized, central

 Amniotic Fluid
 AFI FV:      Within normal limits

                             Largest Pocket(cm)

Biometry

 BPD:      48.1  mm     G. Age:  20w 4d         96  %    CI:        75.61   %    70 - 86
                                                         FL/HC:      16.5   %    16.1 -
 HC:      175.4  mm     G. Age:  20w 0d         87  %    HC/AC:      1.20        1.09 -
 AC:      145.6  mm     G. Age:  19w 6d         74  %    FL/BPD:     60.3   %
 FL:         29  mm     G. Age:  18w 6d         40  %    FL/AC:      19.9   %    20 - 24
 HUM:      29.9  mm     G. Age:  19w 6d         71  %
 CER:      20.7  mm     G. Age:  19w 6d         74  %
 NFT:       4.4  mm
 LV:        6.2  mm
 CM:        2.4  mm

 Est. FW:     298  gm    0 lb 11 oz      77  %
OB History

 Gravidity:    4         Term:   3
 Living:       3
Gestational Age

 LMP:           19w 0d        Date:  08/05/20                 EDD:   05/12/21
 U/S Today:     19w 6d                                        EDD:   05/06/21
 Best:          19w 0d     Det. By:  LMP  (08/05/20)          EDD:   05/12/21
Anatomy

 Cranium:               Appears normal         LVOT:                   Not well visualized
 Cavum:                 Appears normal         Aortic Arch:            Appears normal
 Ventricles:            Appears normal         Ductal Arch:            Not well visualized
 Choroid Plexus:        Appears normal         Diaphragm:              Not well visualized
 Cerebellum:            Appears normal         Stomach:                Appears normal, left
                                                                       sided
 Posterior Fossa:       Appears normal         Abdomen:                Appears normal
 Nuchal Fold:           Appears normal         Abdominal Wall:         Appears nml (cord
                                                                       insert, abd wall)
 Face:                  Not well visualized    Cord Vessels:           Appears normal (3
                                                                       vessel cord)
 Lips:                  Not well visualized    Kidneys:                Appear normal
 Palate:                Not well visualized    Bladder:                Appears normal
 Thoracic:              Appears normal         Spine:                  Appears normal
 Heart:                 Not well visualized    Upper Extremities:      Appears normal
 RVOT:                  Not well visualized    Lower Extremities:      Appears normal

 Other:  Fetus appears to be a male. Heels/feet and open hands visualized.
         Technically difficult due to fetal position.
Cervix Uterus Adnexa

 Cervix
 Length:           3.97  cm.
 Normal appearance by transabdominal scan.

 Uterus
 No abnormality visualized.

 Right Ovary
 Within normal limits.

 Left Ovary
 Within normal limits.

 Cul De Sac
 No free fluid seen.

 Adnexa
 No abnormality visualized.
Impression

 G4 P3.  Patient is here for fetal anatomy scan.
 On cell-free fetal DNA screening, the risks of fetal
 aneuploidies are not increased .MSAFP screening showed
 low risk for open-neural tube defects .

 Obstetrical history significant for 3 term vaginal deliveries.

 We performed fetal anatomy scan. No makers of
 aneuploidies or fetal structural defects are seen. Fetal
 biometry is consistent with her previously-established dates.
 Amniotic fluid is normal and good fetal activity is seen.
 Patient understands the limitations of ultrasound in detecting
 fetal anomalies.
 Patient did not want a male physician to perform ultrasound.
Recommendations

 -An appointment was made for her to return in 4 weeks for
 completion of fetal anatomy.
                 Auerbach, Jhon Lenon

## 2022-09-18 ENCOUNTER — Telehealth: Payer: Self-pay

## 2022-09-18 NOTE — Telephone Encounter (Signed)
LVM for patient to call back. AS, CMA 

## 2024-03-06 DIAGNOSIS — J101 Influenza due to other identified influenza virus with other respiratory manifestations: Secondary | ICD-10-CM | POA: Diagnosis not present
